# Patient Record
Sex: Female | Born: 1960 | Race: Black or African American | Hispanic: No | Marital: Single | Smoking: Never smoker
Health system: Southern US, Community
[De-identification: ages and names within clinical notes are randomized; demographics above are authoritative.]

## PROBLEM LIST (undated history)

## (undated) HISTORY — PX: UTERINE ARTERY EMBOLIZATION: SHX2629

---

## 2013-12-26 ENCOUNTER — Other Ambulatory Visit (HOSPITAL_COMMUNITY): Payer: Self-pay | Admitting: *Deleted

## 2013-12-26 DIAGNOSIS — N6320 Unspecified lump in the left breast, unspecified quadrant: Secondary | ICD-10-CM

## 2013-12-27 ENCOUNTER — Encounter (INDEPENDENT_AMBULATORY_CARE_PROVIDER_SITE_OTHER): Payer: Self-pay

## 2013-12-27 ENCOUNTER — Ambulatory Visit (HOSPITAL_COMMUNITY)
Admission: RE | Admit: 2013-12-27 | Discharge: 2013-12-27 | Disposition: A | Discharge status: Home / Self Care | Payer: Self-pay | Source: Ambulatory Visit | Attending: Obstetrics and Gynecology | Admitting: Obstetrics and Gynecology

## 2013-12-27 ENCOUNTER — Encounter (HOSPITAL_COMMUNITY): Payer: Self-pay

## 2013-12-27 VITALS — BP 120/74 | Temp 98.4°F | Ht 62.0 in | Wt 153.8 lb

## 2013-12-27 DIAGNOSIS — N6322 Unspecified lump in the left breast, upper inner quadrant: Secondary | ICD-10-CM | POA: Insufficient documentation

## 2013-12-27 DIAGNOSIS — Z1239 Encounter for other screening for malignant neoplasm of breast: Secondary | ICD-10-CM

## 2013-12-27 NOTE — Progress Notes (Signed)
Complaints of left breast pain since December 2014 that has increased. Patient rated the pain at a 9 out of 10.  Pap Smear:  Pap smear not completed today. Last Pap smear was in August 2012 and normal per patient. Per patient has no history of an abnormal Pap smear. No Pap smear results in EPIC.  Physical exam: Breasts Breasts symmetrical. No skin abnormalities bilateral breasts. No nipple retraction bilateral breasts. No nipple discharge bilateral breasts. No lymphadenopathy. No lumps palpated right breast. Palpated a small pea sized lump within the left breast at 10 o'clock 8 cm from the nipple. Patient complained of tenderness when palpated lump. [facility]. Appointment scheduled for Wednesday, January 04, 2014 at 0845.  Pelvic/Bimanual No Pap smear completed today since last Pap smear was in August 2012. Pap smear not indicated per BCCCP guidelines.

## 2013-12-27 NOTE — Patient Instructions (Signed)
Taught Kelsey PrestoPatricia Vargas how to perform BSE and gave educational materials to take home. Patient did not need a Pap smear today due to last Pap smear was in August 2012 per patient. Let her know BCCCP will cover Pap smears every 3 years unless has a history of abnormal Pap smears and that her next Pap smear is due this August. [facility]. Appointment scheduled for Wednesday, January 04, 2014 at 0845. Patient aware of appointment and will be there. Kelsey PrestoPatricia Alfonzo verbalized understanding.  Saintclair Halstedhristine Poll Sourish Allender, RN 1:48 PM

## 2014-01-04 ENCOUNTER — Ambulatory Visit
Admission: RE | Admit: 2014-01-04 | Discharge: 2014-01-04 | Disposition: A | Discharge status: Home / Self Care | Payer: Self-pay | Source: Ambulatory Visit | Attending: Obstetrics and Gynecology | Admitting: Obstetrics and Gynecology

## 2014-01-04 ENCOUNTER — Other Ambulatory Visit (HOSPITAL_COMMUNITY): Payer: Self-pay | Admitting: Obstetrics and Gynecology

## 2014-01-04 DIAGNOSIS — N6320 Unspecified lump in the left breast, unspecified quadrant: Secondary | ICD-10-CM

## 2014-01-04 DIAGNOSIS — N6310 Unspecified lump in the right breast, unspecified quadrant: Secondary | ICD-10-CM

## 2014-01-06 ENCOUNTER — Ambulatory Visit (HOSPITAL_BASED_OUTPATIENT_CLINIC_OR_DEPARTMENT_OTHER): Payer: Self-pay

## 2014-01-06 ENCOUNTER — Other Ambulatory Visit: Payer: Self-pay

## 2014-01-06 DIAGNOSIS — Z0000 Encounter for general adult medical examination without abnormal findings: Secondary | ICD-10-CM

## 2014-01-06 LAB — LIPID PANEL
Cholesterol: 164 mg/dL (ref 0–200)
HDL: 49 mg/dL (ref >39)
LDL CALC: 98 mg/dL (ref 0–99)
Total CHOL/HDL Ratio: 3.3 Ratio
Triglycerides: 87 mg/dL (ref <150)
VLDL: 17 mg/dL (ref 0–40)

## 2014-01-06 LAB — HEMOGLOBIN A1C
Hgb A1c MFr Bld: 5.6 % (ref <5.7)
Mean Plasma Glucose: 114 mg/dL (ref <117)

## 2014-01-06 LAB — GLUCOSE (CC13): GLUCOSE: 86 mg/dL (ref 70–140)

## 2014-01-06 NOTE — Patient Instructions (Signed)
Discussed health assessment with patient.She will be called with results of lab work and we will then discussed any further follow up the patient needs. Patient verbalized understanding. 

## 2014-01-06 NOTE — Progress Notes (Signed)
Patient is a new patient to [facility] and is currently a BCCCP patient effective 12/27/2013.   Clinical Measurements: Patient is 5 ft. 6 inches, weight 169.8 lbs, waist circumference 35 inches, and hip circumference 40.75inches.   Medical History: Patient has no history of high cholesterol. Patient does not have a history of hypertension or diabetes. Per patient no diagnosed history of coronary heart disease, heart attack, heart failure, stroke/TIA, vascular disease or congenital heart defects.   Blood Pressure, Self-measurement: Patient states has no reason to check Blood pressure.  Nutrition Assessment: Patient stated that eats one fruit every day. Patient states she eats 2 servings of vegetables a day.. She  Does not eat 3 or more ounces of whole grains daily. Patient doesn't eat two or more servings of fish weekly.Patient states she does not drink more than 36 ounces or 450 calories of beverages with added sugars weekly. Patient stated she does watch her salt intake.  Physical Activity Assessment: Patient stated she only does 330 minutes of moderate exercise a week and no vigorous exercise.   Smoking Status: Patient had never smoked though is around smoke about 2 hours a day.  Quality of Life Assessment: In assessing patient's quality of life she stated that out of the past 30 days that she has felt her Physical health is good all of them. Patient also stated that in the past 30 days that her mental health is not good including stress, depression and problems with emotions for 4 days. Patient did state that out of the past 30 days she felt her physical or mental health had not kept her from doing her usual activities including self-care, work or recreation.   Plan: Lab work will be done today including a lipid panel, blood glucose, and Hgb A1C. Will call lab results when they are finished. Patient will return for Health Coaching and BP check. Will work on behavior modifications that  we discussed to lower diastolic BP.

## 2014-01-10 ENCOUNTER — Telehealth: Payer: Self-pay

## 2014-01-10 NOTE — Telephone Encounter (Signed)
Attempted to call  Patient to inform of normal lab results. Left message to return call.

## 2014-01-11 ENCOUNTER — Telehealth: Payer: Self-pay

## 2014-01-11 NOTE — Telephone Encounter (Signed)
Returned call concerning lab results. Informed of all lab results and scheduled an appointment for Health Coaching to recheck Blood pressure and work on stress reduction.

## 2014-01-26 ENCOUNTER — Ambulatory Visit: Payer: Self-pay

## 2014-01-26 ENCOUNTER — Telehealth (HOSPITAL_COMMUNITY): Payer: Self-pay | Admitting: *Deleted

## 2014-01-26 NOTE — Telephone Encounter (Signed)
Patient contacted me in regards to her diagnostic mammogram results. Patient stated she has not received her results. Told patient to call the [facility] and speak to the nurse. Let her know they will give her the results to her mammogram and if she doesn't hear from them by Monday to call me back. Patient verbalized understanding.

## 2014-02-21 ENCOUNTER — Telehealth: Payer: Self-pay

## 2014-02-21 NOTE — Telephone Encounter (Addendum)
Missed Health Coaching in April for elevated blood pressure and nutrition for being overweight. Rescheduled an appointment for June 19 @ 8 AM for [facility]  PLAN: Call day before to remind patient of appointment and record information in Sports administrator for [place].

## 2014-03-03 ENCOUNTER — Ambulatory Visit: Payer: Self-pay | Admitting: *Deleted

## 2014-03-03 VITALS — BP 118/82

## 2014-03-03 DIAGNOSIS — Z789 Other specified health status: Secondary | ICD-10-CM

## 2014-03-03 NOTE — Progress Notes (Signed)
Patient returns today for Health Coaching regarding elevated blood pressure, Activity, and Nutrition.(50 minutes education)  HYPERTENSION: Blood Pressure is 118/82. Patient kept saying that her blood pressure had never been as high as was on January 06, 2014. [facility] [employer] and has been under a lot of stress. Discussed behavior modifications for elevated blood pressure that can not be changed and then went over importance of increasing exercise and decreasing Weight, stress, alcohol, salt, and smoking. Patient does not smoke or drink. She stated that when studies does snack on peanuts and some other chips. Went over and gave handout concerning ten ways to decrease salt in your life.   NUTRITION: Discussed the importance of eating three balance meals a day. Patient states is just been eating twice a day and snacking at night. States that she has not been getting enough sleep. Discussed portion sizes, increasing fiber content with complex carbohydrates, serving sizes. States that has been real stressed but just two more week until graduates. Discussed ideal weight range. Patient received handouts and reviewd on: What's on your Plate, Choose My Plate, stress,and healthy eating for an active lifestyle, choosing whole-grain foods,  Make better food choices and serving sizes.  ACTIVITY: Stated that was walking and using pedometer.Per patient tries to do 10,000 steps per day.   PLAN: Review handouts and follow information at home. Will call in a month or two to follow up on how patient is doing.Will do further health coaching as needed and follow blood pressure. Will try to decrease weight. Will manage stress.

## 2014-03-03 NOTE — Patient Instructions (Signed)
Watch salt intake, Decrease weight and stress. Offered weight watchers but did not want at present time.Be aware of portion sizes and to eat three balanced meals a day. Verbalized understanding.

## 2014-04-25 ENCOUNTER — Telehealth: Payer: Self-pay

## 2014-04-25 NOTE — Telephone Encounter (Signed)
Called to follow up with Health Coaching on [facility] and make sure blood pressure ok and discuss going to the clinic. Asked patient to return call.

## 2014-05-15 ENCOUNTER — Telehealth: Payer: Self-pay

## 2014-05-15 NOTE — Telephone Encounter (Signed)
Second attempt to reach patient. Message left on cell and home voice mail to call back for follow up on [facility].

## 2014-05-17 ENCOUNTER — Telehealth: Payer: Self-pay

## 2014-05-17 NOTE — Telephone Encounter (Signed)
Called to follow up on [facility]. Patient stated that had made some changes in eating habits but not enough. When asked about exercise patient stated was not doing enough. Per patient is checking blood pressure and it has been with in normal range.  Plan: Call in 3 to 4 weeks for re assessment and re screen in a year from enrollment.

## 2014-06-09 ENCOUNTER — Other Ambulatory Visit: Payer: Self-pay | Admitting: Obstetrics and Gynecology

## 2014-06-09 DIAGNOSIS — N63 Unspecified lump in unspecified breast: Secondary | ICD-10-CM

## 2014-06-23 ENCOUNTER — Telehealth: Payer: Self-pay

## 2014-06-23 NOTE — Telephone Encounter (Signed)
Health Coaching [facility]: Patient called due to a lab bill that keeps receiving. Per patient she is gaining weight because her clothes are too getting tight. Patient stated that she was doing the Atkins diet. Informed patient that it was not a healthy choice that she needed to refer back to the [facility] book. After talking with patient it appears that she is not eating enough and is eating fatty foods. Patient and I discussed healthy options in preparing and what choices she can make. Informed patient that need to be eating 4 to 5 ounces/ servings grains/day, 21/2 cups vegetables, 2 cups fruit and 4 to 5 ounces protein. Discussed that needed to be eating more fish. Discussed how could prepare and not eat fried foods. Discussed what items were considered protein and grains/starches and what was a serving size.  Plan: Will try and follow number of servings to eat/day. Will not eat fried foods.Will eat three times a day. Will continue to increase exercise.Will measure serving sizes. Will follow up with patient again in 2 to 3 weeks. Patient will fax bill over to Manager again, so can follow up.

## 2014-07-07 ENCOUNTER — Encounter (INDEPENDENT_AMBULATORY_CARE_PROVIDER_SITE_OTHER): Payer: Self-pay

## 2014-07-07 ENCOUNTER — Ambulatory Visit
Admission: RE | Admit: 2014-07-07 | Discharge: 2014-07-07 | Disposition: A | Discharge status: Home / Self Care | Payer: No Typology Code available for payment source | Source: Ambulatory Visit | Attending: Obstetrics and Gynecology | Admitting: Obstetrics and Gynecology

## 2014-07-07 DIAGNOSIS — N63 Unspecified lump in unspecified breast: Secondary | ICD-10-CM

## 2014-07-17 ENCOUNTER — Encounter (HOSPITAL_COMMUNITY): Payer: Self-pay

## 2014-08-09 ENCOUNTER — Ambulatory Visit (HOSPITAL_COMMUNITY)
Admission: RE | Admit: 2014-08-09 | Discharge: 2014-08-09 | Disposition: A | Discharge status: Home / Self Care | Payer: Self-pay | Source: Ambulatory Visit | Attending: Obstetrics and Gynecology | Admitting: Obstetrics and Gynecology

## 2014-08-09 ENCOUNTER — Encounter (HOSPITAL_COMMUNITY): Payer: Self-pay

## 2014-08-09 VITALS — BP 112/74 | Ht 62.0 in | Wt 152.6 lb

## 2014-08-09 DIAGNOSIS — Z01419 Encounter for gynecological examination (general) (routine) without abnormal findings: Secondary | ICD-10-CM

## 2014-08-09 NOTE — Progress Notes (Signed)
No complaints today.  Pap Smear: Pap smear completed today. Last Pap smear was in August 2012 and normal per patient. Per patient has no history of an abnormal Pap smear. No Pap smear results in EPIC.    Pelvic/Bimanual   Ext Genitalia No lesions, no swelling and no discharge observed on external genitalia.         Vagina Vagina pink and normal texture. No lesions or discharge observed in vagina.          Cervix Cervix is present. Cervix pink and of normal texture. No discharge observed.     Uterus Uterus is present and palpable. Uterus in normal position and normal size.        Adnexae Bilateral ovaries present and palpable. No tenderness on palpation.          Rectovaginal No rectal exam completed today since patient had no rectal complaints. No skin abnormalities observed on exam.

## 2014-08-09 NOTE — Patient Instructions (Signed)
Explained to Kelsey PrestoPatricia Vargas that BCCCP will cover Pap smears and co-testing every 5 years unless has a history of abnormal Pap smears. Let patient know will follow up with her within the next couple weeks with results of Pap smear by phone. Kelsey Vargas verbalized understanding.

## 2014-08-14 LAB — CYTOLOGY - PAP

## 2014-09-04 ENCOUNTER — Telehealth (HOSPITAL_COMMUNITY): Payer: Self-pay | Admitting: *Deleted

## 2014-09-04 NOTE — Telephone Encounter (Signed)
Telephoned patient at home # and discussed negative pap smear results. Negative HPV results. Next pap due in 5 years. Patient voiced understanding.

## 2014-09-13 ENCOUNTER — Telehealth: Payer: Self-pay

## 2014-09-13 NOTE — Telephone Encounter (Signed)
Called for follow up assessment and left message to return call.

## 2014-09-14 ENCOUNTER — Telehealth: Payer: Self-pay

## 2014-09-14 NOTE — Telephone Encounter (Signed)
This is a follow up Assessment following Health Coaching with [facility]. .  Medication Status : Patient states is not taking medication for high cholesterol, hypertension, or diabetes.   Blood Pressure, Self-measurement: Patient states has no reason to check Blood pressure.  Nutrition Assessment: Patient stated that eats 2 fruits every day. Patient states she eats 2 servings of vegetables a day. Per Patient eats 3 or more ounces of whole grains daily. Patient stated doesn't eat two or more servings of fish weekly.  Patient states she does not drink more than 36 ounces or 450 calories of beverages with added sugars weekly. Patient stated she does watch her salt intake.   Physical Activity Assessment: Patient stated she goes to gym and is going yoga, Tia Chi, and other class during week 3 and 4 nights. Per patient is doing around 840 minutes of moderate activity and 120 minutes vigorous per week. Smoking Status: Patient had never smoked until 6 months ago. Her present boyfriend is a heavy smoker. She smokes about one small cigar a night. Patient is exposed to smoke a half a hour  A day.  Quality of Life Assessment: In assessing patient's quality of life she stated that out of the past 30 days that she has felt her health is good all of them. Patient also stated that in the past 30 days that her mental health is not good including stress, depression and problems with emotions for all days. Patient did state that out of the past 30 days she felt her physical or mental health had not kept her from doing her usual activities including self-care, work or recreation.   OTHER: Per patient has joined the gym and increased activity and is feeling a lot better.  Plan: [facility] due to has [employer] and will not need BCCCP next year.

## 2014-11-28 ENCOUNTER — Telehealth (HOSPITAL_COMMUNITY): Payer: Self-pay | Admitting: *Deleted

## 2014-11-28 NOTE — Telephone Encounter (Signed)
Attempted to call patient to schedule BCCCP appointment. No one answered phone. Left voicemail for patient to call me back. 

## 2014-11-28 NOTE — Telephone Encounter (Signed)
Patient returned phone call. Reminder [facility] to [facility]. 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 She is no longer eligible for BCCCP. Let patient know she can call the Breast Center directly to schedule. Patient verbalized understanding.

## 2015-07-03 NOTE — Progress Notes (Signed)
Patient received health insurance in March and has not been seen in BCCCP in over a year. Patient's status is now inactive with [facility] effective 07/03/2015 dur to not eligible for BCCCP.

## 2015-12-14 ENCOUNTER — Encounter: Payer: Self-pay | Admitting: Internal Medicine

## 2015-12-14 ENCOUNTER — Ambulatory Visit (INDEPENDENT_AMBULATORY_CARE_PROVIDER_SITE_OTHER): Payer: Self-pay | Admitting: Internal Medicine

## 2015-12-14 ENCOUNTER — Ambulatory Visit: Payer: Self-pay | Admitting: Allergy and Immunology

## 2015-12-14 VITALS — BP 110/76 | HR 80 | Temp 98.4°F | Resp 16 | Ht 62.01 in | Wt 147.9 lb

## 2015-12-14 DIAGNOSIS — J3089 Other allergic rhinitis: Secondary | ICD-10-CM

## 2015-12-14 DIAGNOSIS — L209 Atopic dermatitis, unspecified: Secondary | ICD-10-CM | POA: Diagnosis not present

## 2015-12-14 DIAGNOSIS — J302 Other seasonal allergic rhinitis: Secondary | ICD-10-CM | POA: Insufficient documentation

## 2015-12-14 MED ORDER — OLOPATADINE HCL 0.7 % OP SOLN: 1.0000 [drp] | Freq: Every day | OPHTHALMIC | Status: DC

## 2015-12-14 MED ORDER — OLOPATADINE HCL 0.7 % OP SOLN: 1.0000 | Freq: Every day | OPHTHALMIC | Status: DC

## 2015-12-14 MED ORDER — LEVOCETIRIZINE DIHYDROCHLORIDE 5 MG PO TABS: 5.0000 mg | ORAL_TABLET | Freq: Every evening | ORAL | Status: DC

## 2015-12-14 MED ORDER — MONTELUKAST SODIUM 10 MG PO TABS: 10.0000 mg | ORAL_TABLET | ORAL | Status: DC | PRN

## 2015-12-14 MED ORDER — DESONIDE 0.05 % EX CREA: TOPICAL_CREAM | CUTANEOUS | Status: AC

## 2015-12-14 MED ORDER — FLUTICASONE PROPIONATE 50 MCG/ACT NA SUSP: 2.0000 | Freq: Every day | NASAL | Status: DC

## 2015-12-14 MED ORDER — MONTELUKAST SODIUM 10 MG PO TABS: 10.0000 mg | ORAL_TABLET | Freq: Every day | ORAL | Status: DC

## 2015-12-14 MED ORDER — EPINEPHRINE 0.3 MG/0.3ML IJ SOAJ
0.3000 mg | Freq: Once | INTRAMUSCULAR | Status: DC
Start: 2015-12-14 — End: 2017-09-22

## 2015-12-14 NOTE — Assessment & Plan Note (Signed)
   Start desonide twice a day to affected areas sparingly as needed  Use products that do not contain any dyes or perfumes (free and clear products)

## 2015-12-14 NOTE — Patient Instructions (Addendum)
Other allergic rhinitis  Start allergy injections. Given EpiPen and action plan head educated on use  Use Xyzal (levocetirizine) 5 mg daily  Use fluticasone 2 sprays each nostril daily  Use montelukast (Singulair) 10 mg daily  Use Pataday or Pazeo 1 drop each eye daily Given Prednisone 10 mg tablets. Take 1 tablet twice a day for 4 days, then 1 tablet on day #5.   Atopic dermatitis  Start desonide twice a day to affected areas sparingly as needed  Use products that do not contain any dyes or perfumes (free and clear products)

## 2015-12-14 NOTE — Progress Notes (Signed)
History of Present Illness: Kelsey Vargas is a 55 y.o. female presenting for follow-up  HPI Comments: Allergic rhinitis/dermatitis: Skin testing in the past was positive for grass, weed, tree, mold, dog, cat. She was started on Xyzal, fluticasone, Zaditor, montelukast and prednisone at last visit and had good symptom control. She was able to use her medications on an as needed basis after the spring. For the past several days, she has been having return of her symptoms. Specifically, she has had hoarseness, ocular pruritus, eyelid dermatitis. She restarted her Veramyst, Singulair, Pazeo last week but has continued to have breakthrough symptoms.   No current outpatient prescriptions on file prior to visit.   No current facility-administered medications on file prior to visit.    Assessment and Plan: Other allergic rhinitis  Start allergy injections. Given EpiPen and action plan - educated on use  Use Xyzal (levocetirizine) 5 mg daily  Use fluticasone 2 sprays each nostril daily  Use montelukast (Singulair) 10 mg daily  Use Pataday or Pazeo 1 drop each eye daily Given Prednisone 10 mg tablets. Take 1 tablet twice a day for 4 days, then 1 tablet on day #5.   Atopic dermatitis  Start desonide twice a day to affected areas sparingly as needed  Use products that do not contain any dyes or perfumes (free and clear products)    Return in about 4 weeks (around 01/11/2016).  Meds ordered this encounter  Medications  . DISCONTD: PAZEO 0.7 % SOLN    Sig:   . fluticasone (VERAMYST) 27.5 MCG/SPRAY nasal spray    Sig: Place 1 spray into the nose as needed for rhinitis.  Marland Kitchen DISCONTD: montelukast (SINGULAIR) 10 MG tablet    Sig: Take 10 mg by mouth as needed.  Marland Kitchen EPINEPHrine (EPIPEN 2-PAK) 0.3 mg/0.3 mL IJ SOAJ injection    Sig: Inject 0.3 mLs (0.3 mg total) into the muscle once.    Dispense:  1 Device    Refill:  3  . levocetirizine (XYZAL) 5 MG tablet    Sig: Take 1 tablet (5 mg  total) by mouth every evening.    Dispense:  30 tablet    Refill:  5  . fluticasone (EQL FLUTICASONE PROPIONATE) 50 MCG/ACT nasal spray    Sig: Place 2 sprays into both nostrils daily.    Dispense:  1 g    Refill:  5  . montelukast (SINGULAIR) 10 MG tablet    Sig: Take 1 tablet (10 mg total) by mouth at bedtime.    Dispense:  30 tablet    Refill:  5  . montelukast (SINGULAIR) 10 MG tablet    Sig: Take 1 tablet (10 mg total) by mouth as needed.    Dispense:  30 tablet    Refill:  5  . DISCONTD: Olopatadine HCl (PAZEO) 0.7 % SOLN    Sig: Apply 1 Bottle to eye daily.    Dispense:  1 Bottle    Refill:  5  . desonide (DESOWEN) 0.05 % cream    Sig: APPLY TO AFFECTED AREAS AS NEEDED.    Dispense:  30 g    Refill:  3  . Olopatadine HCl (PAZEO) 0.7 % SOLN    Sig: Place 1 drop into both eyes daily.    Dispense:  1 Bottle    Refill:  5   Physical Exam: BP 110/76 mmHg  Pulse 80  Temp(Src) 98.4 F (36.9 C) (Oral)  Resp 16  Ht 5' 2.01" (1.575 m)  Wt 147 lb 14.9  oz (67.1 kg)  BMI 27.05 kg/m2   Physical Exam  Constitutional: She appears well-developed and well-nourished. No distress.  HENT:  Right Ear: External ear normal.  Left Ear: External ear normal.  Mouth/Throat: Oropharynx is clear and moist.  Nasal membrane edema, pale, clear rhinorrhea  Eyes: Conjunctivae are normal. Right eye exhibits no discharge. Left eye exhibits no discharge.  Cardiovascular: Normal rate, regular rhythm and normal heart sounds.   No murmur heard. Pulmonary/Chest: Effort normal and breath sounds normal. No respiratory distress. She has no wheezes. She has no rales.  Abdominal: Soft. Bowel sounds are normal.  Musculoskeletal: She exhibits no edema.  Lymphadenopathy:    She has no cervical adenopathy.  Neurological: She is alert.  Skin: No rash noted.  Vitals reviewed.   Drug Allergies:  No Known Allergies  ROS: Per HPI unless specifically indicated below Review of Systems  Thank you for the  opportunity to care for this patient.  Please do not hesitate to contact me with questions.

## 2015-12-14 NOTE — Assessment & Plan Note (Addendum)
   Start allergy injections. Given EpiPen and action plan - educated on use  Use Xyzal (levocetirizine) 5 mg daily  Use fluticasone 2 sprays each nostril daily  Use montelukast (Singulair) 10 mg daily  Use Pataday or Pazeo 1 drop each eye daily Given Prednisone 10 mg tablets. Take 1 tablet twice a day for 4 days, then 1 tablet on day #5.

## 2015-12-17 ENCOUNTER — Other Ambulatory Visit: Payer: Self-pay | Admitting: Allergy

## 2015-12-17 MED ORDER — OLOPATADINE HCL 0.7 % OP SOLN: 1.0000 [drp] | Freq: Every day | OPHTHALMIC | Status: DC

## 2015-12-19 DIAGNOSIS — J301 Allergic rhinitis due to pollen: Secondary | ICD-10-CM | POA: Diagnosis not present

## 2015-12-20 ENCOUNTER — Encounter: Payer: Self-pay | Admitting: *Deleted

## 2015-12-20 DIAGNOSIS — J3089 Other allergic rhinitis: Secondary | ICD-10-CM | POA: Diagnosis not present

## 2015-12-31 ENCOUNTER — Ambulatory Visit (INDEPENDENT_AMBULATORY_CARE_PROVIDER_SITE_OTHER): Payer: BLUE CROSS/BLUE SHIELD

## 2015-12-31 DIAGNOSIS — J309 Allergic rhinitis, unspecified: Secondary | ICD-10-CM | POA: Diagnosis not present

## 2015-12-31 NOTE — Progress Notes (Signed)
Immunotherapy   Patient Details  Name: Kelsey Vargas MRN: [id number] Date of Birth: 09/20/1960  12/31/2015  Kelsey PrestoPatricia Vargas started injections for  Blue 100,000 (weed-mold & Grass-Tree-Cat-Dog) Following schedule: A  Frequency:1 time per week Epi-Pen:Epi-Pen Available  Consent signed and patient instructions given.   Damita Gainey 12/31/2015, 9:07 AM

## 2016-01-07 ENCOUNTER — Ambulatory Visit (INDEPENDENT_AMBULATORY_CARE_PROVIDER_SITE_OTHER): Payer: BLUE CROSS/BLUE SHIELD | Admitting: *Deleted

## 2016-01-07 DIAGNOSIS — J309 Allergic rhinitis, unspecified: Secondary | ICD-10-CM

## 2016-01-14 ENCOUNTER — Ambulatory Visit: Payer: BLUE CROSS/BLUE SHIELD | Admitting: Internal Medicine

## 2016-01-17 ENCOUNTER — Ambulatory Visit (INDEPENDENT_AMBULATORY_CARE_PROVIDER_SITE_OTHER): Payer: BLUE CROSS/BLUE SHIELD | Admitting: *Deleted

## 2016-01-17 DIAGNOSIS — J309 Allergic rhinitis, unspecified: Secondary | ICD-10-CM

## 2016-01-21 ENCOUNTER — Ambulatory Visit (INDEPENDENT_AMBULATORY_CARE_PROVIDER_SITE_OTHER): Payer: BLUE CROSS/BLUE SHIELD | Admitting: Internal Medicine

## 2016-01-21 ENCOUNTER — Ambulatory Visit (INDEPENDENT_AMBULATORY_CARE_PROVIDER_SITE_OTHER): Payer: BLUE CROSS/BLUE SHIELD

## 2016-01-21 ENCOUNTER — Encounter: Payer: Self-pay | Admitting: Internal Medicine

## 2016-01-21 VITALS — BP 122/84 | HR 84 | Temp 98.9°F | Resp 16

## 2016-01-21 DIAGNOSIS — J3089 Other allergic rhinitis: Secondary | ICD-10-CM

## 2016-01-21 DIAGNOSIS — J309 Allergic rhinitis, unspecified: Secondary | ICD-10-CM

## 2016-01-21 DIAGNOSIS — L209 Atopic dermatitis, unspecified: Secondary | ICD-10-CM

## 2016-01-21 MED ORDER — BECLOMETHASONE DIPROPIONATE 80 MCG/ACT NA AERS: 2.0000 | INHALATION_SPRAY | Freq: Every day | NASAL | Status: DC

## 2016-01-21 MED ORDER — LEVOCETIRIZINE DIHYDROCHLORIDE 5 MG PO TABS: 5.0000 mg | ORAL_TABLET | Freq: Every evening | ORAL | Status: DC

## 2016-01-21 NOTE — Assessment & Plan Note (Addendum)
   On immunotherapy. Has EpiPen and action plan  Start Xyzal 5 mg daily  Continue Singulair 10 mg daily as needed, Pazeo 1 drop each eye daily as needed  Start Qnasl 2 sprays each nostril daily

## 2016-01-21 NOTE — Assessment & Plan Note (Signed)
   Continue as needed desonide to affected areas

## 2016-01-21 NOTE — Patient Instructions (Addendum)
Other allergic rhinitis  On immunotherapy. Has EpiPen and action plan  Start Xyzal 5 mg daily  Continue Singulair 10 mg daily as needed, Pazeo 1 drop each eye daily as needed  Start Qnasl 2 sprays each nostril daily  Atopic dermatitis  Continue as needed desonide to affected areas

## 2016-01-21 NOTE — Progress Notes (Signed)
History of Present Illness: Kelsey Vargas is a 55 y.o. female presenting for follow-up  HPI Comments: Allergic rhinitis on immunotherapy: Start 12/31/2015. She is continuing to build without any shot reactions. She uses her medications on an as-needed basis and has been having breakthrough nasal symptoms. She would like to try a different nasal spray  Atopic dermatitis: At patient's last visit, she was started on desonide and has had resolution of her symptoms.   Assessment and Plan: Other allergic rhinitis  On immunotherapy. Has EpiPen and action plan  Start Xyzal 5 mg daily  Continue Singulair 10 mg daily as needed, Pazeo 1 drop each eye daily as needed  Start Qnasl 2 sprays each nostril daily  Atopic dermatitis  Continue as needed desonide to affected areas    Return in about 1 year (around 01/20/2017).  Current Outpatient Prescriptions on File Prior to Visit  Medication Sig Dispense Refill  . desonide (DESOWEN) 0.05 % cream APPLY TO AFFECTED AREAS AS NEEDED. 30 g 3  . EPINEPHrine (EPIPEN 2-PAK) 0.3 mg/0.3 mL IJ SOAJ injection Inject 0.3 mLs (0.3 mg total) into the muscle once. 1 Device 3  . montelukast (SINGULAIR) 10 MG tablet Take 1 tablet (10 mg total) by mouth as needed. 30 tablet 5  . Olopatadine HCl (PAZEO) 0.7 % SOLN Place 1 drop into both eyes daily. 1 Bottle 5  . fluticasone (VERAMYST) 27.5 MCG/SPRAY nasal spray Place 1 spray into the nose as needed for rhinitis. Reported on 01/21/2016     No current facility-administered medications on file prior to visit.    Meds ordered this encounter  Medications  . NON FORMULARY    Sig:   . hydrocortisone cream 1 %    Sig: Apply 1 application topically as needed for itching.  . levocetirizine (XYZAL) 5 MG tablet    Sig: Take 1 tablet (5 mg total) by mouth every evening.    Dispense:  30 tablet    Refill:  5    For runny nose or itching  . Beclomethasone Dipropionate 80 MCG/ACT AERS    Sig: Place 2 sprays into the nose  daily.    Dispense:  8.7 g    Refill:  5   Physical Exam: BP 122/84 mmHg  Pulse 84  Temp(Src) 98.9 F (37.2 C) (Oral)  Resp 16   Physical Exam  Constitutional: She appears well-developed and well-nourished. No distress.  HENT:  Right Ear: External ear normal.  Left Ear: External ear normal.  Nose: Nose normal.  Mouth/Throat: Oropharynx is clear and moist.  Eyes: Conjunctivae are normal. Right eye exhibits no discharge. Left eye exhibits no discharge.  Cardiovascular: Normal rate, regular rhythm and normal heart sounds.   No murmur heard. Pulmonary/Chest: Effort normal and breath sounds normal. No respiratory distress. She has no wheezes. She has no rales.  Abdominal: Soft. Bowel sounds are normal.  Musculoskeletal: She exhibits no edema.  Lymphadenopathy:    She has no cervical adenopathy.  Neurological: She is alert.  Skin: No rash noted.  Vitals reviewed.   Patient Active Problem List   Diagnosis Date Noted  . Other allergic rhinitis 12/14/2015  . Atopic dermatitis 12/14/2015  . Breast lump on left side at 10 o'clock position 12/27/2013    Drug Allergies:  No Known Allergies  ROS: Per HPI unless specifically indicated below Review of Systems  Thank you for the opportunity to care for this patient.  Please do not hesitate to contact me with questions.

## 2016-02-06 ENCOUNTER — Ambulatory Visit (INDEPENDENT_AMBULATORY_CARE_PROVIDER_SITE_OTHER): Payer: BLUE CROSS/BLUE SHIELD

## 2016-02-06 ENCOUNTER — Other Ambulatory Visit: Payer: Self-pay

## 2016-02-06 DIAGNOSIS — J309 Allergic rhinitis, unspecified: Secondary | ICD-10-CM | POA: Diagnosis not present

## 2016-02-06 MED ORDER — BECLOMETHASONE DIPROPIONATE 80 MCG/ACT NA AERS: 2.0000 | INHALATION_SPRAY | Freq: Every day | NASAL | Status: DC

## 2016-02-19 ENCOUNTER — Ambulatory Visit (INDEPENDENT_AMBULATORY_CARE_PROVIDER_SITE_OTHER): Payer: BLUE CROSS/BLUE SHIELD | Admitting: *Deleted

## 2016-02-19 DIAGNOSIS — J309 Allergic rhinitis, unspecified: Secondary | ICD-10-CM

## 2016-02-21 ENCOUNTER — Ambulatory Visit (INDEPENDENT_AMBULATORY_CARE_PROVIDER_SITE_OTHER): Payer: BLUE CROSS/BLUE SHIELD

## 2016-02-21 DIAGNOSIS — J309 Allergic rhinitis, unspecified: Secondary | ICD-10-CM

## 2016-03-06 ENCOUNTER — Ambulatory Visit (INDEPENDENT_AMBULATORY_CARE_PROVIDER_SITE_OTHER): Payer: BLUE CROSS/BLUE SHIELD

## 2016-03-06 DIAGNOSIS — J309 Allergic rhinitis, unspecified: Secondary | ICD-10-CM

## 2016-03-12 ENCOUNTER — Ambulatory Visit (INDEPENDENT_AMBULATORY_CARE_PROVIDER_SITE_OTHER): Payer: BLUE CROSS/BLUE SHIELD

## 2016-03-12 DIAGNOSIS — J309 Allergic rhinitis, unspecified: Secondary | ICD-10-CM

## 2016-03-20 ENCOUNTER — Ambulatory Visit (INDEPENDENT_AMBULATORY_CARE_PROVIDER_SITE_OTHER): Payer: BLUE CROSS/BLUE SHIELD

## 2016-03-20 DIAGNOSIS — J309 Allergic rhinitis, unspecified: Secondary | ICD-10-CM

## 2016-03-25 ENCOUNTER — Ambulatory Visit (INDEPENDENT_AMBULATORY_CARE_PROVIDER_SITE_OTHER): Payer: BLUE CROSS/BLUE SHIELD

## 2016-03-25 DIAGNOSIS — J309 Allergic rhinitis, unspecified: Secondary | ICD-10-CM

## 2016-04-03 ENCOUNTER — Ambulatory Visit (INDEPENDENT_AMBULATORY_CARE_PROVIDER_SITE_OTHER): Payer: BLUE CROSS/BLUE SHIELD

## 2016-04-03 DIAGNOSIS — J309 Allergic rhinitis, unspecified: Secondary | ICD-10-CM | POA: Diagnosis not present

## 2016-04-08 ENCOUNTER — Ambulatory Visit (INDEPENDENT_AMBULATORY_CARE_PROVIDER_SITE_OTHER): Payer: BLUE CROSS/BLUE SHIELD | Admitting: *Deleted

## 2016-04-08 DIAGNOSIS — J309 Allergic rhinitis, unspecified: Secondary | ICD-10-CM | POA: Diagnosis not present

## 2016-04-15 ENCOUNTER — Ambulatory Visit (INDEPENDENT_AMBULATORY_CARE_PROVIDER_SITE_OTHER): Payer: BLUE CROSS/BLUE SHIELD

## 2016-04-15 DIAGNOSIS — J309 Allergic rhinitis, unspecified: Secondary | ICD-10-CM

## 2016-04-24 ENCOUNTER — Ambulatory Visit (INDEPENDENT_AMBULATORY_CARE_PROVIDER_SITE_OTHER): Payer: BLUE CROSS/BLUE SHIELD

## 2016-04-24 DIAGNOSIS — J309 Allergic rhinitis, unspecified: Secondary | ICD-10-CM

## 2016-05-02 ENCOUNTER — Ambulatory Visit (INDEPENDENT_AMBULATORY_CARE_PROVIDER_SITE_OTHER): Payer: BLUE CROSS/BLUE SHIELD | Admitting: *Deleted

## 2016-05-02 DIAGNOSIS — J309 Allergic rhinitis, unspecified: Secondary | ICD-10-CM | POA: Diagnosis not present

## 2016-05-06 ENCOUNTER — Ambulatory Visit (INDEPENDENT_AMBULATORY_CARE_PROVIDER_SITE_OTHER): Payer: BLUE CROSS/BLUE SHIELD | Admitting: *Deleted

## 2016-05-06 DIAGNOSIS — J309 Allergic rhinitis, unspecified: Secondary | ICD-10-CM

## 2016-05-16 ENCOUNTER — Ambulatory Visit (INDEPENDENT_AMBULATORY_CARE_PROVIDER_SITE_OTHER): Payer: BLUE CROSS/BLUE SHIELD

## 2016-05-16 DIAGNOSIS — J309 Allergic rhinitis, unspecified: Secondary | ICD-10-CM | POA: Diagnosis not present

## 2016-05-20 ENCOUNTER — Ambulatory Visit (INDEPENDENT_AMBULATORY_CARE_PROVIDER_SITE_OTHER): Payer: BLUE CROSS/BLUE SHIELD

## 2016-05-20 DIAGNOSIS — J309 Allergic rhinitis, unspecified: Secondary | ICD-10-CM

## 2016-06-03 ENCOUNTER — Ambulatory Visit (INDEPENDENT_AMBULATORY_CARE_PROVIDER_SITE_OTHER): Payer: BLUE CROSS/BLUE SHIELD

## 2016-06-03 DIAGNOSIS — J309 Allergic rhinitis, unspecified: Secondary | ICD-10-CM | POA: Diagnosis not present

## 2016-06-04 ENCOUNTER — Encounter: Payer: Self-pay | Admitting: Emergency Medicine

## 2016-06-04 ENCOUNTER — Emergency Department (INDEPENDENT_AMBULATORY_CARE_PROVIDER_SITE_OTHER)
Admission: EM | Admit: 2016-06-04 | Discharge: 2016-06-04 | Disposition: A | Discharge status: Home / Self Care | Payer: BLUE CROSS/BLUE SHIELD | Source: Home / Self Care | Attending: Family Medicine | Admitting: Family Medicine

## 2016-06-04 DIAGNOSIS — L03115 Cellulitis of right lower limb: Secondary | ICD-10-CM | POA: Diagnosis not present

## 2016-06-04 DIAGNOSIS — L03116 Cellulitis of left lower limb: Secondary | ICD-10-CM

## 2016-06-04 MED ORDER — PREDNISONE 20 MG PO TABS: 20.0000 mg | ORAL_TABLET | Freq: Two times a day (BID) | ORAL | 0 refills | Status: AC

## 2016-06-04 MED ORDER — DOXYCYCLINE HYCLATE 100 MG PO CAPS: 100.0000 mg | ORAL_CAPSULE | Freq: Two times a day (BID) | ORAL | 0 refills | Status: AC

## 2016-06-04 NOTE — ED Provider Notes (Signed)
[facility]    CSN: [id number] Arrival date & time: 06/04/16  1338  First Provider Contact:  First MD Initiated Contact with Patient 06/04/16 1407        History   Chief Complaint Chief Complaint  Patient presents with  . Insect Bite    HPI Kelsey Vargas is a 55 y.o. female.   Patient reports that 3 days ago while sitting in a chair, she felt small insects on her lower legs, and subsequently noticed a pruritic rash.  Several lesions have become increasingly red and swollen.  She feels well otherwise.  No fevers, chills, and sweats.   The history is provided by the patient.  Rash  Location:  Leg Leg rash location:  L upper leg, R upper leg and L lower leg Quality: dryness, itchiness and redness   Quality: not blistering, not bruising, not burning, not draining, not painful, not peeling, not scaling, not swelling and not weeping   Severity:  Moderate Onset quality:  Gradual Duration:  3 days Timing:  Constant Progression:  Worsening Chronicity:  New Context: insect bite/sting   Context: not animal contact, not chemical exposure, not eggs, not exposure to similar rash, not food, not hot tub use, not medications, not new detergent/soap and not plant contact   Relieved by:  None tried Worsened by:  Nothing Ineffective treatments:  None tried Associated symptoms: no diarrhea, no fatigue, no fever, no headaches, no induration, no joint pain, no myalgias, no nausea, no shortness of breath, no sore throat, no throat swelling, no tongue swelling and no URI     History reviewed. No pertinent past medical history.  Patient Active Problem List   Diagnosis Date Noted  . Other allergic rhinitis 12/14/2015  . Atopic dermatitis 12/14/2015  . Breast lump on left side at 10 o'clock position 12/27/2013    Past Surgical History:  Procedure Laterality Date  . UTERINE ARTERY EMBOLIZATION      OB History    Gravida Para Term Preterm AB Living   2 2 2     2    SAB TAB  Ectopic Multiple Live Births                   Home Medications    Prior to Admission medications   Medication Sig Start Date End Date Taking? Authorizing Provider  Beclomethasone Dipropionate (QNASL) 80 MCG/ACT AERS Place 2 puffs into the nose daily. 02/06/16   Mikki Santee, MD  Beclomethasone Dipropionate 80 MCG/ACT AERS Place 2 sprays into the nose daily. 01/21/16   Mikki Santee, MD  desonide (DESOWEN) 0.05 % cream APPLY TO AFFECTED AREAS AS NEEDED. 12/14/15   Mikki Santee, MD  doxycycline (VIBRAMYCIN) 100 MG capsule Take 1 capsule (100 mg total) by mouth 2 (two) times daily. Take with food. 06/04/16   Lattie Haw, MD  EPINEPHrine (EPIPEN 2-PAK) 0.3 mg/0.3 mL IJ SOAJ injection Inject 0.3 mLs (0.3 mg total) into the muscle once. 12/14/15   Mikki Santee, MD  fluticasone (VERAMYST) 27.5 MCG/SPRAY nasal spray Place 1 spray into the nose as needed for rhinitis. Reported on 01/21/2016    Historical Provider, MD  hydrocortisone cream 1 % Apply 1 application topically as needed for itching.    Historical Provider, MD  levocetirizine (XYZAL) 5 MG tablet Take 1 tablet (5 mg total) by mouth every evening. 01/21/16   Mikki Santee, MD  montelukast (SINGULAIR) 10 MG tablet Take 1 tablet (10 mg total) by mouth as needed. 12/14/15  Mikki Santee, MD  NON FORMULARY     Historical Provider, MD  Olopatadine HCl (PAZEO) 0.7 % SOLN Place 1 drop into both eyes daily. 12/17/15   Mikki Santee, MD  predniSONE (DELTASONE) 20 MG tablet Take 1 tablet (20 mg total) by mouth 2 (two) times daily. Take with food. 06/04/16   Lattie Haw, MD    Family History Family History  Problem Relation Age of Onset  . Cancer Mother     lung  . Hypertension Mother   . Hypertension Father   . Cancer Brother     colon  . Cancer Maternal Grandmother     colon  . Hyperlipidemia Sister   . Hypertension Sister   . Stroke Sister     Social History Social History  Substance Use Topics  . Smoking status: Never Smoker  . Smokeless  tobacco: Never Used  . Alcohol use No     Allergies   Review of patient's allergies indicates no known allergies.   Review of Systems Review of Systems  Constitutional: Negative for fatigue and fever.  HENT: Negative for sore throat.   Respiratory: Negative for shortness of breath.   Gastrointestinal: Negative for diarrhea and nausea.  Musculoskeletal: Negative for arthralgias and myalgias.  Skin: Positive for rash.  Neurological: Negative for headaches.  All other systems reviewed and are negative.    Physical Exam Triage Vital Signs ED Triage Vitals  Enc Vitals Group     BP 06/04/16 1400 116/80     Pulse Rate 06/04/16 1400 96     Resp --      Temp 06/04/16 1400 98.2 F (36.8 C)     Temp Source 06/04/16 1400 Oral     SpO2 06/04/16 1400 96 %     Weight 06/04/16 1400 150 lb (68 kg)     Height 06/04/16 1400 5\' 2"  (1.575 m)     Head Circumference --      Peak Flow --      Pain Score 06/04/16 1402 2     Pain Loc --      Pain Edu? --      Excl. in GC? --    No data found.   Updated Vital Signs BP 116/80 (BP Location: Left Arm)   Pulse 96   Temp 98.2 F (36.8 C) (Oral)   Ht 5\' 2"  (1.575 m)   Wt 150 lb (68 kg)   SpO2 96%   BMI 27.44 kg/m   Visual Acuity Right Eye Distance:   Left Eye Distance:   Bilateral Distance:    Right Eye Near:   Left Eye Near:    Bilateral Near:     Physical Exam  Constitutional: She appears well-developed and well-nourished. No distress.  HENT:  Head: Normocephalic.  Right Ear: External ear normal.  Left Ear: External ear normal.  Nose: Nose normal.  Mouth/Throat: Oropharynx is clear and moist.  Eyes: Pupils are equal, round, and reactive to light.  Neck: Neck supple.  Cardiovascular: Normal heart sounds.   Pulmonary/Chest: Breath sounds normal.  Abdominal: There is no tenderness.  Musculoskeletal: She exhibits no edema.  Lymphadenopathy:    She has no cervical adenopathy.  Neurological: She is alert.  Skin: Skin is  warm and dry. Rash noted.     Both medial thighs, and left posterior calf, have areas of macular erythema as noted on diagram.  No tenderness, warmth, or fluctuance.  Nursing note and vitals reviewed.    UC Treatments / Results  Labs (all labs ordered are listed, but only abnormal results are displayed) Labs Reviewed - No data to display  EKG  EKG Interpretation None       Radiology No results found.  Procedures Procedures (including critical care time)  Medications Ordered in UC Medications - No data to display   Initial Impression / Assessment and Plan / UC Course  I have reviewed the triage vital signs and the nursing notes.  Pertinent labs & imaging results that were available during my care of the patient were reviewed by me and considered in my medical decision making (see chart for details).  Clinical Course  No evidence DVT on exam Begin doxycycline 100mg  BID for staph coverage.  Prednisone burst. May take Benadryl at bedtime if needed for itching. If symptoms become significantly worse during the night or over the weekend, proceed to the local emergency room.  Followup with Family Doctor if not improved in about 5 days.     Final Clinical Impressions(s) / UC Diagnoses   Final diagnoses:  Cellulitis of right lower extremity  Cellulitis of left lower extremity    New Prescriptions New Prescriptions   DOXYCYCLINE (VIBRAMYCIN) 100 MG CAPSULE    Take 1 capsule (100 mg total) by mouth 2 (two) times daily. Take with food.   PREDNISONE (DELTASONE) 20 MG TABLET    Take 1 tablet (20 mg total) by mouth 2 (two) times daily. Take with food.     Lattie HawStephen A Reubin Bushnell, MD 06/12/16 [phone]

## 2016-06-04 NOTE — ED Triage Notes (Signed)
Insect bites on upper legs x 3 days, red, swollen itchy

## 2016-06-04 NOTE — Discharge Instructions (Signed)
May take Benadryl at bedtime if needed for itching. ° °If symptoms become significantly worse during the night or over the weekend, proceed to the local emergency room.  °

## 2016-06-20 ENCOUNTER — Ambulatory Visit (INDEPENDENT_AMBULATORY_CARE_PROVIDER_SITE_OTHER): Payer: BLUE CROSS/BLUE SHIELD | Admitting: *Deleted

## 2016-06-20 DIAGNOSIS — J3089 Other allergic rhinitis: Secondary | ICD-10-CM | POA: Diagnosis not present

## 2016-06-26 ENCOUNTER — Ambulatory Visit (INDEPENDENT_AMBULATORY_CARE_PROVIDER_SITE_OTHER): Payer: BLUE CROSS/BLUE SHIELD | Admitting: *Deleted

## 2016-06-26 DIAGNOSIS — J309 Allergic rhinitis, unspecified: Secondary | ICD-10-CM

## 2016-06-26 DIAGNOSIS — J3089 Other allergic rhinitis: Secondary | ICD-10-CM

## 2016-07-03 ENCOUNTER — Ambulatory Visit (INDEPENDENT_AMBULATORY_CARE_PROVIDER_SITE_OTHER): Payer: BLUE CROSS/BLUE SHIELD | Admitting: *Deleted

## 2016-07-03 DIAGNOSIS — J3089 Other allergic rhinitis: Secondary | ICD-10-CM | POA: Diagnosis not present

## 2016-07-10 ENCOUNTER — Ambulatory Visit (INDEPENDENT_AMBULATORY_CARE_PROVIDER_SITE_OTHER): Payer: BLUE CROSS/BLUE SHIELD | Admitting: *Deleted

## 2016-07-10 DIAGNOSIS — J3089 Other allergic rhinitis: Secondary | ICD-10-CM

## 2016-07-17 ENCOUNTER — Ambulatory Visit (INDEPENDENT_AMBULATORY_CARE_PROVIDER_SITE_OTHER): Payer: BLUE CROSS/BLUE SHIELD | Admitting: *Deleted

## 2016-07-17 DIAGNOSIS — J3089 Other allergic rhinitis: Secondary | ICD-10-CM

## 2016-07-24 ENCOUNTER — Ambulatory Visit (INDEPENDENT_AMBULATORY_CARE_PROVIDER_SITE_OTHER): Payer: BLUE CROSS/BLUE SHIELD | Admitting: *Deleted

## 2016-07-24 DIAGNOSIS — J3089 Other allergic rhinitis: Secondary | ICD-10-CM | POA: Diagnosis not present

## 2016-07-31 ENCOUNTER — Ambulatory Visit (INDEPENDENT_AMBULATORY_CARE_PROVIDER_SITE_OTHER): Payer: BLUE CROSS/BLUE SHIELD | Admitting: *Deleted

## 2016-07-31 DIAGNOSIS — J3089 Other allergic rhinitis: Secondary | ICD-10-CM

## 2016-07-31 NOTE — Progress Notes (Signed)
Immunotherapy   Patient Details  Name: Kelsey Prestoatricia Kelliher MRN: [id number] Date of Birth: 20-Apr-1961  07/31/2016  Kelsey PrestoPatricia Jenniges here to pick up  Gold vial (1:10,000) - Grass-Tree-Cat-Dog & Green vial (1:1000) - Weed-Mold.  Following schedule: A  Frequency:1-2 times per week.  Epi-Pen:Epi-Pen Available  Consent signed and patient instructions given. Patient will be getting her injections @ work from the nurse Garwin BrothersAnn Durbin, RN @ [employer].    Vella RedheadHeather Clark 07/31/2016, 6:23 PM

## 2016-09-23 ENCOUNTER — Ambulatory Visit: Payer: Self-pay | Admitting: *Deleted

## 2016-09-25 DIAGNOSIS — J3089 Other allergic rhinitis: Secondary | ICD-10-CM | POA: Diagnosis not present

## 2016-09-26 DIAGNOSIS — J301 Allergic rhinitis due to pollen: Secondary | ICD-10-CM | POA: Diagnosis not present

## 2016-10-20 ENCOUNTER — Encounter: Payer: Self-pay | Admitting: *Deleted

## 2016-10-20 NOTE — Progress Notes (Signed)
Immunotherapy   Patient Details  Name: Kelsey Vargas MRN: [id number] Date of Birth: Feb 08, 1961  09/30/2016  Kelsey PrestoPatricia Collignon [facility]  [facility].  Following schedule: A  Frequency:1 time per week Epi-Pen:Epi-Pen Available  Consent signed and patient instructions given. Patient picked up vials only. Patient gets shot at her job by Garwin BrothersAnn Durbin, RN @ [employer].    Vella RedheadHeather Clark 10/20/2016, 2:25 PM

## 2016-10-21 ENCOUNTER — Ambulatory Visit: Payer: Self-pay

## 2016-10-21 NOTE — Progress Notes (Signed)
Immunotherapy   Patient Details  Name: Kelsey Prestoatricia Vargas MRN: [id number] Date of Birth: 04-Oct-1960  10/21/2016  Kelsey PrestoPatricia Vargas here to pick up  allergy vial.  Following schedule: A  Frequency:1 time per week Epi-Pen:Epi-Pen Available  Consent signed and patient instructions given.  Patient picked up green vial Grass-tree-cat-dog today. No shot given. Pt receives injections by Gus PumaAnn D[place] with [employer].   Amber Wood 10/21/2016, 11:52 AM

## 2016-12-25 ENCOUNTER — Encounter: Payer: Self-pay | Admitting: *Deleted

## 2016-12-25 ENCOUNTER — Ambulatory Visit (INDEPENDENT_AMBULATORY_CARE_PROVIDER_SITE_OTHER): Payer: BLUE CROSS/BLUE SHIELD | Admitting: *Deleted

## 2016-12-25 DIAGNOSIS — J309 Allergic rhinitis, unspecified: Secondary | ICD-10-CM | POA: Diagnosis not present

## 2017-01-05 NOTE — Progress Notes (Signed)
Immunotherapy   Patient Details  Name: Kelsey Vargas MRN: [id number] Date of Birth: 20-Apr-1961  12/25/2016  Vivien Presto here to pick up  Green vial 1:1000 - Grass-Tree-Cat-Dog & Red vial 1:100 - Weed-Mold. Following schedule: A  Frequency:1 time per week Epi-Pen:Epi-Pen Available  Consent signed and patient instructions given. No problem after 30 minutes in office. Patient receives injections from her nurse at work. [employer], RN.    Vella Redhead 12/26/2016, 11:02 AM

## 2017-02-10 ENCOUNTER — Telehealth: Payer: Self-pay | Admitting: Allergy

## 2017-02-10 ENCOUNTER — Other Ambulatory Visit: Payer: Self-pay | Admitting: Allergy

## 2017-02-10 ENCOUNTER — Other Ambulatory Visit: Payer: Self-pay

## 2017-02-10 MED ORDER — BECLOMETHASONE DIPROPIONATE 80 MCG/ACT NA AERS: 2.0000 | INHALATION_SPRAY | Freq: Every day | NASAL | 0 refills | Status: DC

## 2017-02-10 NOTE — Telephone Encounter (Signed)
Patient said she gets shots and was last seen in [place] by Dr. Clydie BraunBhatti on 01-21-16. She wanted a refill on her Qnasal. [facility] pharmacy is [facility] [address]. I made her an appointment with Dr. Delorse LekPadgett on 02-13-17. [place] time.

## 2017-02-10 NOTE — Telephone Encounter (Signed)
Denied refill of Qnasl . Patient needs office visit

## 2017-02-10 NOTE — Telephone Encounter (Signed)
Sent in 1 month supply as pt needs and OV before and more refills

## 2017-02-13 ENCOUNTER — Encounter: Payer: Self-pay | Admitting: Allergy

## 2017-02-13 ENCOUNTER — Ambulatory Visit (INDEPENDENT_AMBULATORY_CARE_PROVIDER_SITE_OTHER): Payer: BLUE CROSS/BLUE SHIELD | Admitting: Allergy

## 2017-02-13 VITALS — BP 112/70 | HR 72 | Resp 16

## 2017-02-13 DIAGNOSIS — H1010 Acute atopic conjunctivitis, unspecified eye: Secondary | ICD-10-CM | POA: Diagnosis not present

## 2017-02-13 DIAGNOSIS — J309 Allergic rhinitis, unspecified: Secondary | ICD-10-CM

## 2017-02-13 MED ORDER — OLOPATADINE HCL 0.7 % OP SOLN: 1.0000 [drp] | Freq: Every day | OPHTHALMIC | 5 refills | Status: DC

## 2017-02-13 MED ORDER — BECLOMETHASONE DIPROPIONATE 80 MCG/ACT NA AERS: 2.0000 | INHALATION_SPRAY | Freq: Every day | NASAL | 5 refills | Status: DC

## 2017-02-13 MED ORDER — LEVOCETIRIZINE DIHYDROCHLORIDE 5 MG PO TABS: 5.0000 mg | ORAL_TABLET | Freq: Every evening | ORAL | 5 refills | Status: AC

## 2017-02-13 NOTE — Progress Notes (Signed)
Follow-up Note  RE: Kelsey Vargas MRN: [id number] DOB: 07-Sep-1961 Date of Office Visit: 02/13/2017   History of present illness: Kelsey Vargas is a 56 y.o. female presenting today for follow-up of allegic rhinoconjunctivitis.  She was last seen in the office by Dr. Beaulah Dinning on 02/20/2015.  She reports she has been doing well since this last visit without any major illnesses, surgeries or hospitalizations.    Pt has been on allergen immunotherapy since 12/31/15 and receives 2 injections weekly.   She has progressed to maintenance dose for one of the two shots an dis on green vial of the other. She reports that since she started the shots, she has been able to use levocetirizine, Qnasl, Pazeo, and Singulair only as needed, and reports a reduction in her symptoms overall. She has a nurse at her workplace who is able to administer the shots for her on a weekly basis. She denies any large local or systemic reactions.      Review of systems: Review of Systems  Constitutional: Negative for chills, fever and weight loss.  HENT: Negative for congestion and sore throat.   Eyes: Negative for discharge and redness.  Respiratory: Negative for cough, shortness of breath and wheezing.   Cardiovascular: Negative for chest pain.  Gastrointestinal: Negative for abdominal pain, diarrhea, heartburn, nausea and vomiting.  Musculoskeletal: Negative for joint pain and myalgias.  Skin: Negative for itching and rash.  Neurological: Negative for headaches.    All other systems negative unless noted above in HPI  Past medical/social/surgical/family history have been reviewed and are unchanged unless specifically indicated below.  No changes  Medication List: Allergies as of 02/13/2017      Reactions   Latex       Medication List       Accurate as of 02/13/17 11:52 AM. Always use your most recent med list.          Beclomethasone Dipropionate 80 MCG/ACT Aers Commonly known as:  QNASL Place 2 puffs  into the nose daily.   desonide 0.05 % cream Commonly known as:  DESOWEN APPLY TO AFFECTED AREAS AS NEEDED.   doxycycline 100 MG capsule Commonly known as:  VIBRAMYCIN Take 1 capsule (100 mg total) by mouth 2 (two) times daily. Take with food.   EPINEPHrine 0.3 mg/0.3 mL Soaj injection Commonly known as:  EPIPEN 2-PAK Inject 0.3 mLs (0.3 mg total) into the muscle once.   fluticasone 27.5 MCG/SPRAY nasal spray Commonly known as:  VERAMYST Place 1 spray into the nose as needed for rhinitis. Reported on 01/21/2016   hydrocortisone cream 1 % Apply 1 application topically as needed for itching.   levocetirizine 5 MG tablet Commonly known as:  XYZAL Take 1 tablet (5 mg total) by mouth every evening.   montelukast 10 MG tablet Commonly known as:  SINGULAIR Take 1 tablet (10 mg total) by mouth as needed.   NON FORMULARY   Olopatadine HCl 0.7 % Soln Commonly known as:  PAZEO Place 1 drop into both eyes daily.   predniSONE 20 MG tablet Commonly known as:  DELTASONE Take 1 tablet (20 mg total) by mouth 2 (two) times daily. Take with food.       Known medication allergies: Allergies  Allergen Reactions  . Latex      Physical examination: Blood pressure 112/70, pulse 72, resp. rate 16.  General: Alert, interactive, in no acute distress. HEENT: PERRLA, TMs pearly gray, turbinates non-edematous without discharge, post-pharynx unremarkable. Neck: Supple without lymphadenopathy. Lungs:  Clear to auscultation without wheezing, rhonchi or rales. {no increased work of breathing. CV: Normal S1, S2 without murmurs. Abdomen: Nondistended, nontender. Skin: Warm and dry, without lesions or rashes. Extremities:  No clubbing, cyanosis or edema. Neuro:   Grossly intact.  Diagnositics/Labs: Labs: none  Assessment and plan:   1. Allergic rhinoconjunctivitis -Symptoms are well-controlled at this point on allergen immunotherapy. Continue allergy shots per protocol.  Discussed will  need to continue once at maintenance dosing for 3-5 duration before considering stopping.   -Continue allergen avoidance measures. -Continue as needed use of your levocetirizine, Qnasl, and Pazeo. Discontinue use of Singulair. -Follow-up in one year or sooner if needed.   I appreciate the opportunity to take part in Kelsey Vargas's care. Please do not hesitate to contact me with questions.  Sincerely,   Margo AyeShaylar Padgett, MD Allergy/Immunology [facility]

## 2017-02-13 NOTE — Patient Instructions (Signed)
1. Allergic rhinoconjunctivitis -Symptoms are well-controlled at this point on allergy shots. Continue allergy shot per protocol. -Continue allergen avoidance measures. -Continue as needed use of your levocetirizine, Qnasl, and Pazeo. Discontinue use of Singulair. -Follow-up in one year or sooner if needed.

## 2017-03-26 ENCOUNTER — Ambulatory Visit (INDEPENDENT_AMBULATORY_CARE_PROVIDER_SITE_OTHER): Payer: BLUE CROSS/BLUE SHIELD | Admitting: *Deleted

## 2017-03-26 DIAGNOSIS — J309 Allergic rhinitis, unspecified: Secondary | ICD-10-CM

## 2017-03-26 NOTE — Progress Notes (Signed)
Immunotherapy   Patient Details  Name: Kelsey Vargas MRN: [id number] Date of Birth: 07/02/1961  03/26/2017  Kelsey PrestoPatricia Vargas here to pick up  Red vial 1:100 - Grass-Tree-Cat-Dog. Following schedule: A  Frequency:1 time per week Epi-Pen:Epi-Pen Available  Consent signed and patient instructions given. No problem after 30 minutes in office. Patient receives injections from her nurse at work. Garwin BrothersAnn Durbin, RN.    Vella RedheadHeather Clark 03/26/2017, 6:39 PM

## 2017-03-31 NOTE — Progress Notes (Signed)
VIALS EXP- 04/03/18 

## 2017-03-31 NOTE — Progress Notes (Signed)
VIAL EXP- 04/03/18

## 2017-04-03 DIAGNOSIS — J301 Allergic rhinitis due to pollen: Secondary | ICD-10-CM | POA: Diagnosis not present

## 2017-04-16 ENCOUNTER — Ambulatory Visit (INDEPENDENT_AMBULATORY_CARE_PROVIDER_SITE_OTHER): Payer: BLUE CROSS/BLUE SHIELD | Admitting: *Deleted

## 2017-04-16 DIAGNOSIS — J309 Allergic rhinitis, unspecified: Secondary | ICD-10-CM | POA: Diagnosis not present

## 2017-04-16 NOTE — Progress Notes (Signed)
Immunotherapy   Patient Details  Name: Kelsey Vargas MRN: [id number] Date of Birth: 05/24/1961  04/16/2017  Kelsey Vargas here to pick up  Red #2 1:00 Weed-Mold Following schedule: A  Frequency:1 time per week Epi-Pen:Epi-Pen Available  Consent signed and patient instructions given.   Bennye AlmMildred Jisell Majer 04/16/2017, 6:46 PM

## 2017-07-03 ENCOUNTER — Encounter: Payer: Self-pay | Admitting: *Deleted

## 2017-07-03 DIAGNOSIS — J301 Allergic rhinitis due to pollen: Secondary | ICD-10-CM | POA: Diagnosis not present

## 2017-07-03 NOTE — Progress Notes (Signed)
Maintenance vial made. Exp: 07/03/18. hc

## 2017-07-09 ENCOUNTER — Ambulatory Visit (INDEPENDENT_AMBULATORY_CARE_PROVIDER_SITE_OTHER): Payer: BLUE CROSS/BLUE SHIELD | Admitting: *Deleted

## 2017-07-09 DIAGNOSIS — J309 Allergic rhinitis, unspecified: Secondary | ICD-10-CM | POA: Diagnosis not present

## 2017-07-09 NOTE — Progress Notes (Signed)
Immunotherapy   Patient Details  Name: Vivien Prestoatricia Shaff MRN: [id number] Date of Birth: [birth year]  07/09/2017  Vivien PrestoPatricia Face here to pick up  Red vial 1:100 Grass-Tree-Cat-Dog & Weed-Mold. Following schedule: A  Frequency:1 time per week Epi-Pen:Epi-Pen Available  Consent signed and patient instructions given. No problems after 30 minutes in the office. Patient receives injections for nurse at [employer] - Ann Durbin, RN. No problems with previous vials. Shot record scanned.   Vella RedheadHeather Clark 07/09/2017, 6:49 PM

## 2017-09-04 ENCOUNTER — Encounter (HOSPITAL_COMMUNITY): Payer: Self-pay

## 2017-09-22 ENCOUNTER — Telehealth: Payer: Self-pay | Admitting: Allergy

## 2017-09-22 MED ORDER — EPINEPHRINE 0.3 MG/0.3ML IJ SOAJ: INTRAMUSCULAR | 1 refills | Status: AC

## 2017-09-22 NOTE — Telephone Encounter (Signed)
Pt called and needs a epi-pen called into [facility]. [phone].

## 2017-09-22 NOTE — Telephone Encounter (Signed)
rx refill sent in  

## 2017-10-20 DIAGNOSIS — J301 Allergic rhinitis due to pollen: Secondary | ICD-10-CM | POA: Diagnosis not present

## 2017-10-21 ENCOUNTER — Encounter: Payer: Self-pay | Admitting: *Deleted

## 2017-10-21 NOTE — Progress Notes (Signed)
Maintenance vial made. Exp: 10-21-18. hv 

## 2017-10-22 ENCOUNTER — Ambulatory Visit: Payer: Self-pay

## 2017-10-23 ENCOUNTER — Ambulatory Visit (INDEPENDENT_AMBULATORY_CARE_PROVIDER_SITE_OTHER): Payer: BLUE CROSS/BLUE SHIELD

## 2017-10-23 DIAGNOSIS — J309 Allergic rhinitis, unspecified: Secondary | ICD-10-CM | POA: Diagnosis not present

## 2021-04-10 ENCOUNTER — Other Ambulatory Visit: Payer: Self-pay | Admitting: Radiology

## 2021-04-10 DIAGNOSIS — N63 Unspecified lump in unspecified breast: Secondary | ICD-10-CM

## 2021-04-19 ENCOUNTER — Other Ambulatory Visit: Payer: Self-pay

## 2021-04-19 ENCOUNTER — Ambulatory Visit
Admission: RE | Admit: 2021-04-19 | Discharge: 2021-04-19 | Disposition: A | Discharge status: Home / Self Care | Payer: BLUE CROSS/BLUE SHIELD | Source: Ambulatory Visit | Attending: Radiology | Admitting: Radiology

## 2021-04-19 DIAGNOSIS — N63 Unspecified lump in unspecified breast: Secondary | ICD-10-CM

## 2022-02-11 ENCOUNTER — Other Ambulatory Visit: Payer: Self-pay

## 2022-04-10 DIAGNOSIS — M8580 Other specified disorders of bone density and structure, unspecified site: Secondary | ICD-10-CM | POA: Diagnosis not present

## 2022-05-07 DIAGNOSIS — H524 Presbyopia: Secondary | ICD-10-CM | POA: Diagnosis not present

## 2022-05-07 DIAGNOSIS — H5213 Myopia, bilateral: Secondary | ICD-10-CM | POA: Diagnosis not present

## 2023-06-30 IMAGING — MG MM DIGITAL DIAGNOSTIC UNILAT*L* W/ TOMO W/ CAD
6 series · 6 of 18 positions shown · non-contrast
Comparison: Previous exams including most recent bilateral
screening mammogram dated 11/01/2020.

CLINICAL DATA: Patient describes a palpable lump/thickening in the
upper-outer quadrant of the LEFT breast.

EXAM:
DIGITAL DIAGNOSTIC UNILATERAL LEFT MAMMOGRAM WITH TOMOSYNTHESIS AND
CAD; ULTRASOUND LEFT BREAST LIMITED
TECHNIQUE: Left digital diagnostic mammography and breast tomosynthesis was
performed. The images were evaluated with computer-aided detection.;
Targeted ultrasound examination of the left breast was performed.

[L MLO synth-2D (1 of 2)]
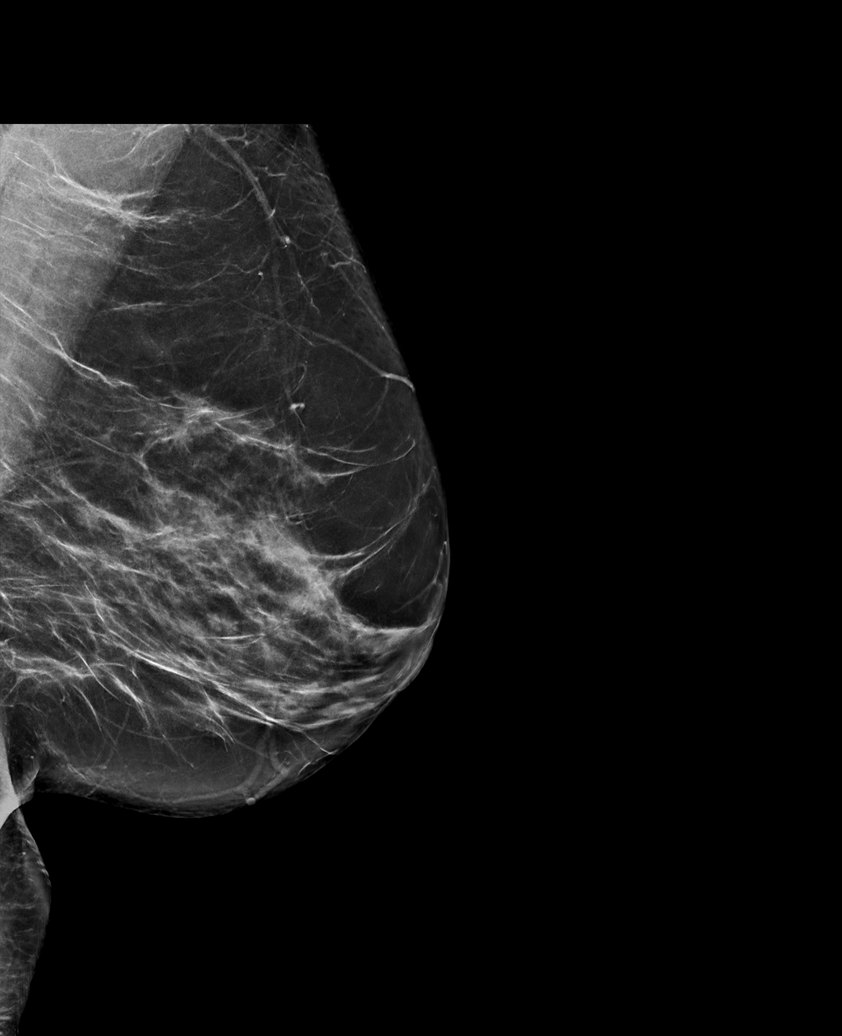

[L MLO synth-2D (2 of 2)]
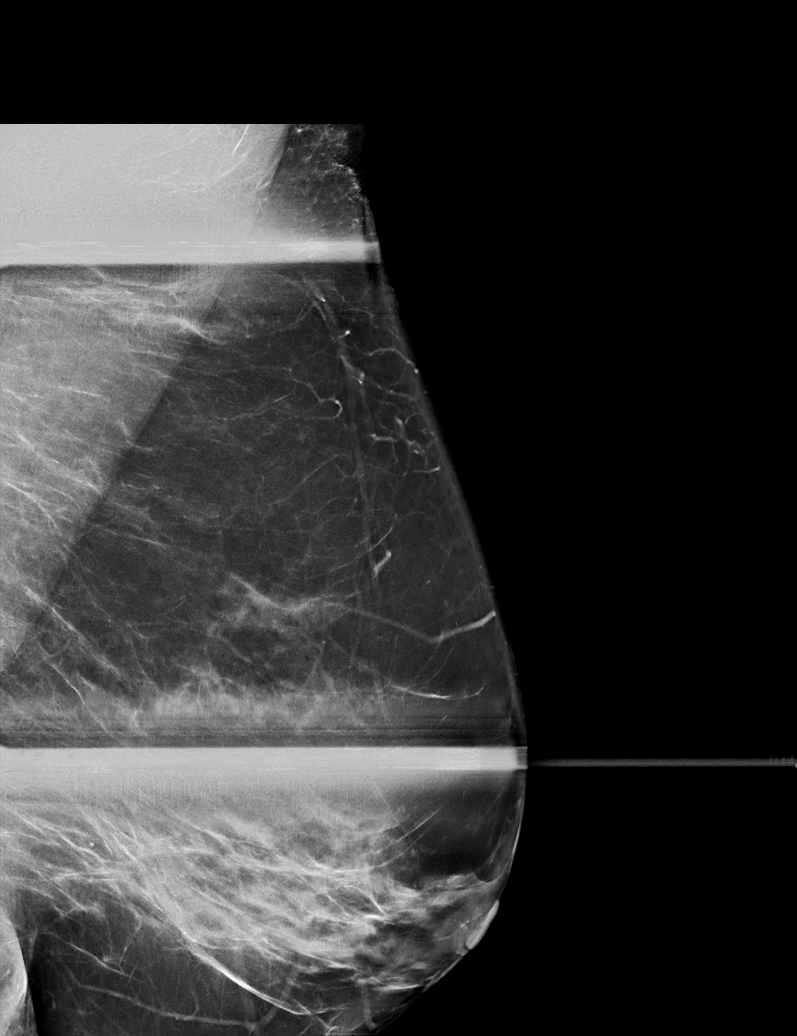

[L CC synth-2D]
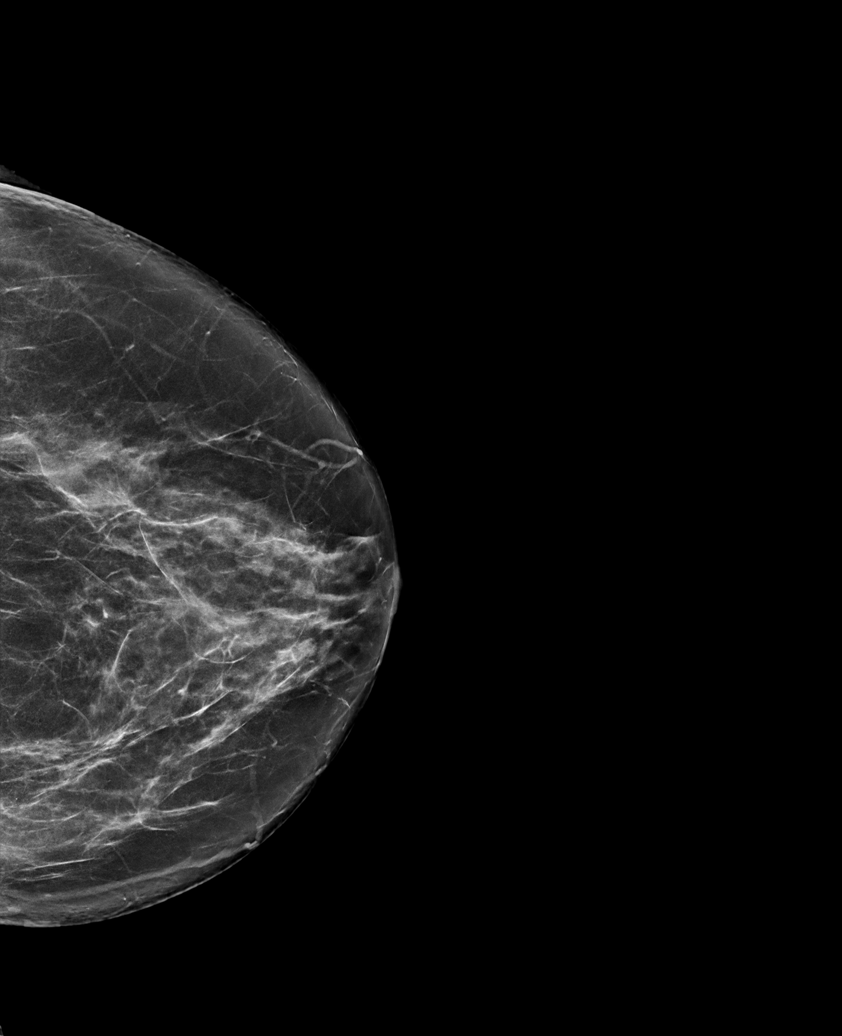

[L CC tomo · tomo slice 39/76.0]
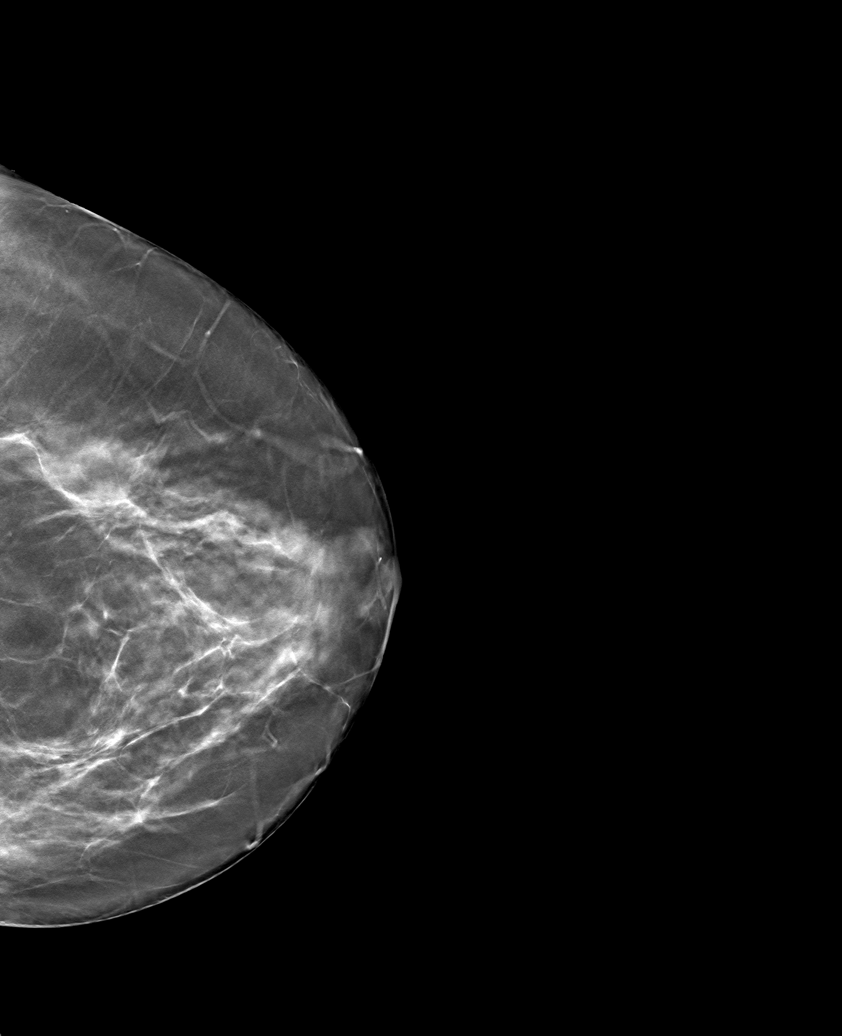

[L MLO tomo (1 of 2) · tomo slice 41/82.0]
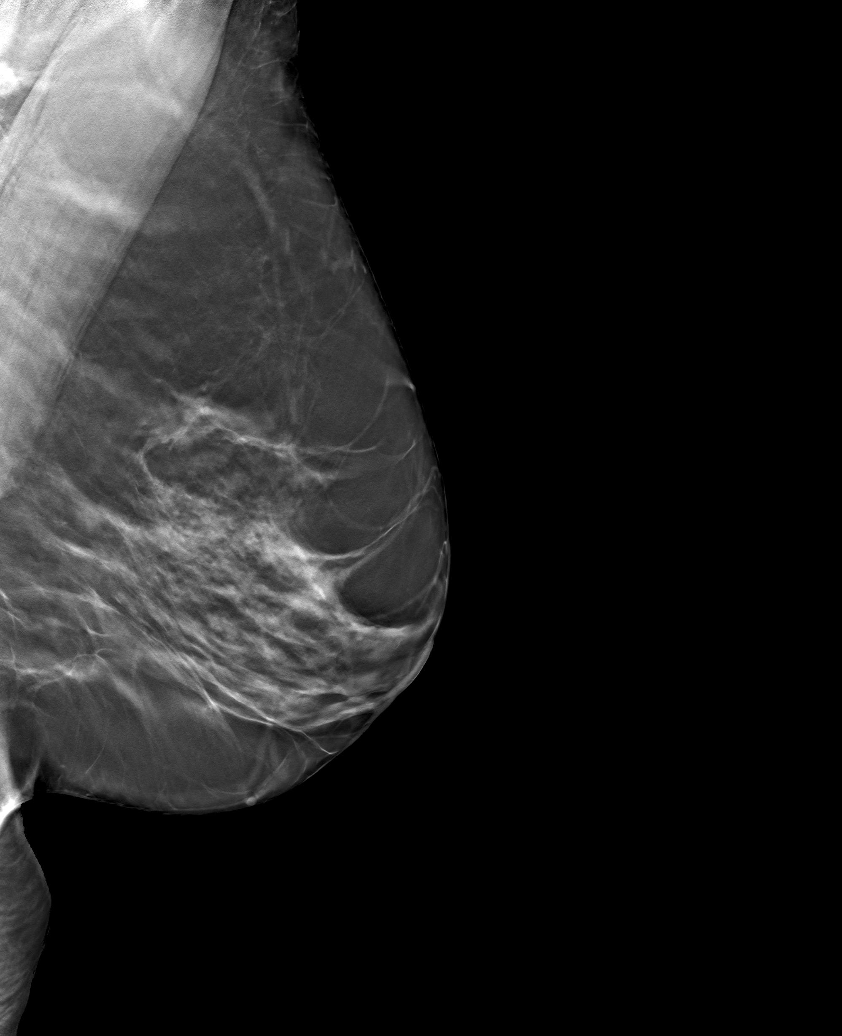

[L MLO tomo (2 of 2) · tomo slice 43/86.0]
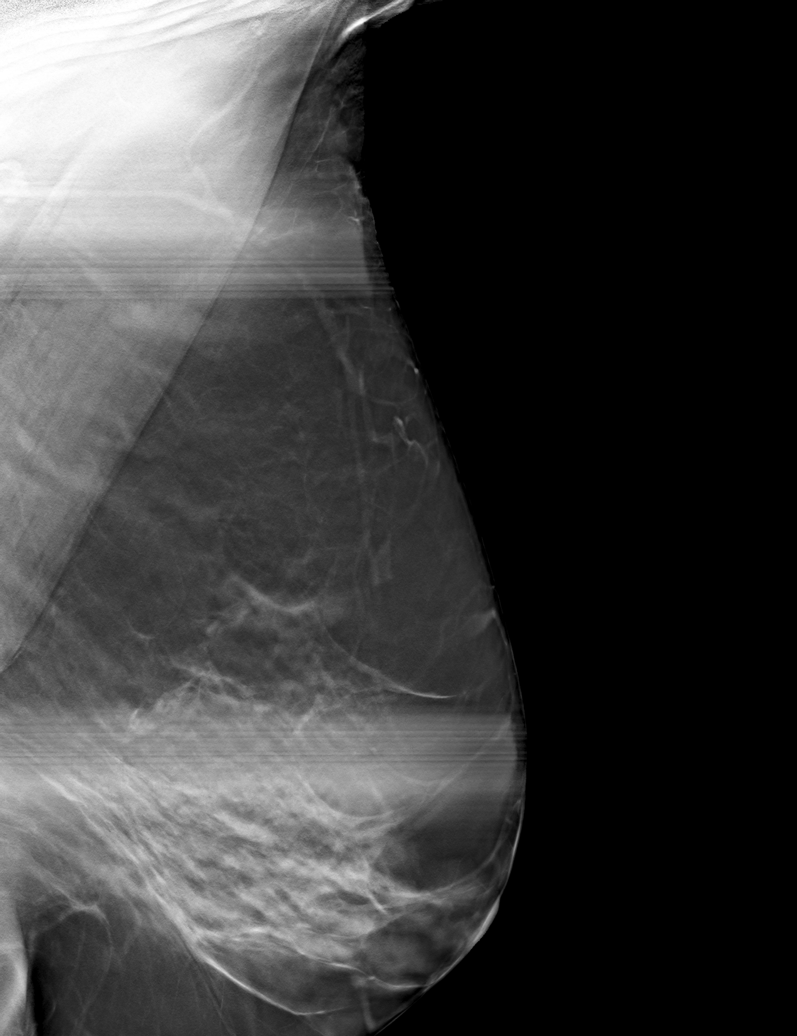

[6 of 18 positions shown; findings below may reference images not displayed]

ACR Breast Density Category c: The breast tissue is heterogeneously
dense, which may obscure small masses.
FINDINGS: LEFT breast diagnostic mammogram: There are no new dominant masses,
suspicious calcifications or secondary signs of malignancy within
the LEFT breast.

Targeted ultrasound is performed, evaluating the upper-outer
quadrant of the LEFT breast as directed by the patient, showing only
normal fibroglandular tissues and fat lobules throughout. No solid
or cystic mass. There is a ridge of normal dense fibroglandular
tissue at the 1-2 o'clock axis, corresponding to the palpable area
of concern.
IMPRESSION: No evidence of malignancy.

RECOMMENDATION:
1. Annual screening mammograms. Next bilateral screening mammogram
will be due in Saturday October, 2021.
2. The patient was instructed to return sooner if the area that she
feels becomes larger and/or firmer to palpation, or if a new
palpable abnormality is identified in either breast.

I have discussed the findings and recommendations with the patient.
If applicable, a reminder letter will be sent to the patient
regarding the next appointment.

BI-RADS CATEGORY  1: Negative.

## 2024-10-07 ENCOUNTER — Encounter: Payer: Self-pay | Admitting: Internal Medicine

## 2024-10-07 ENCOUNTER — Telehealth: Payer: Self-pay | Admitting: Internal Medicine

## 2024-10-07 ENCOUNTER — Other Ambulatory Visit: Payer: Self-pay

## 2024-10-07 ENCOUNTER — Ambulatory Visit: Admitting: Internal Medicine

## 2024-10-07 VITALS — BP 118/74 | HR 88 | Temp 98.0°F | Resp 18 | Ht 62.0 in | Wt 165.4 lb

## 2024-10-07 DIAGNOSIS — H1010 Acute atopic conjunctivitis, unspecified eye: Secondary | ICD-10-CM

## 2024-10-07 DIAGNOSIS — L309 Dermatitis, unspecified: Secondary | ICD-10-CM

## 2024-10-07 DIAGNOSIS — J309 Allergic rhinitis, unspecified: Secondary | ICD-10-CM | POA: Diagnosis not present

## 2024-10-07 DIAGNOSIS — J383 Other diseases of vocal cords: Secondary | ICD-10-CM | POA: Diagnosis not present

## 2024-10-07 MED ORDER — IPRATROPIUM BROMIDE 0.03 % NA SOLN: 2.0000 | Freq: Three times a day (TID) | NASAL | 5 refills | Status: AC

## 2024-10-07 MED ORDER — OLOPATADINE HCL 0.7 % OP SOLN: 1.0000 [drp] | Freq: Every day | OPHTHALMIC | 5 refills | Status: AC | PRN

## 2024-10-07 MED ORDER — MONTELUKAST SODIUM 10 MG PO TABS: 10.0000 mg | ORAL_TABLET | Freq: Every day | ORAL | 5 refills | Status: AC

## 2024-10-07 MED ORDER — QNASL 80 MCG/ACT NA AERS: 2.0000 | INHALATION_SPRAY | Freq: Every day | NASAL | 5 refills | Status: AC

## 2024-10-07 MED ORDER — LORATADINE 10 MG PO CAPS: 10.0000 mg | ORAL_CAPSULE | Freq: Every day | ORAL | 5 refills | Status: AC | PRN

## 2024-10-07 MED ORDER — EUCRISA 2 % EX OINT: TOPICAL_OINTMENT | CUTANEOUS | 3 refills | Status: AC

## 2024-10-07 NOTE — Progress Notes (Signed)
 "  NEW PATIENT Date of Service/Encounter:   10/07/2024 Referring provider: Boneta Melodi BRAVO, PA-C Primary care provider: Boneta Ulyess BRAVO, PA-C  Subjective:  Kelsey Vargas is a 64 y.o. female presenting today for evaluation of allergic rhinitis and vocal cord dysfunction. History obtained from: chart review and patient.   Discussed the use of AI scribe software for clinical note transcription with the patient, who gave verbal consent to proceed.  History of Present Illness Kelsey Vargas is a 64 year old female with allergies who presents with exacerbated allergy symptoms and vocal cord sensitivity.  Allergic rhinitis and ocular symptoms - Perennial symptoms without seasonal variation - Post-nasal drip - Swollen and puffy eyes - Rash under the eyes - Episodes of excessive tearing causing discomfort and requiring urgent care visits for eye rinsing - Occasionally uses half a tablet of cetirizine due to sedative effects - Applies Eucrisa for rash under the eyes - Loratadine effective during the day without drowsiness - Levocetirizine causes grogginess - Prefers Qnasl  nasal spray over wet sprays like Flonase  - Previously used montelukast  without notable side effects - all medications now expired  Vocal cord sensitivity and environmental triggers - Sensitivity to smells triggers vocal cord irritation - Difficulty being around certain scents such as colognes and coffee - Work microbiologist; 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 Jeneal with last visit in 2018.  Stopped allergy injections in February 2019. Pt had been on allergen immunotherapy since 12/31/15 and received 2 injections weekly.   Doing well with injections without reported side effects and significant improvement in symptoms reported.  Her AIT included vial 1: G/T/C/D and vial 2: W/RW/M.    Past Medical History: No past medical history on file. Medication List:  Current Outpatient Medications  Medication Sig Dispense Refill   Beclomethasone Dipropionate  (QNASL ) 80 MCG/ACT AERS Place 2 puffs into the nose daily. 8.7 g 5   desonide  (DESOWEN ) 0.05 % cream APPLY TO AFFECTED AREAS AS NEEDED. 30 g 3   doxycycline  (VIBRAMYCIN ) 100 MG capsule Take 1 capsule (100 mg total) by mouth 2 (two) times daily. Take with food. 14 capsule 0   EPINEPHrine  (EPIPEN  2-PAK) 0.3 mg/0.3 mL IJ SOAJ injection Use as directed for severe allergic reaction 2 Device 1   EPINEPHrine  0.3 mg/0.3 mL IJ SOAJ injection Inject into the muscle.     fluticasone  (VERAMYST) 27.5 MCG/SPRAY nasal spray Place 1 spray into the nose as needed for rhinitis. Reported on 01/21/2016     hydrocortisone cream 1 % Apply 1 application topically as needed for itching.     levocetirizine (XYZAL ) 5 MG tablet Take 1 tablet (5 mg total) by mouth every evening. 30 tablet 5   montelukast  (SINGULAIR ) 10 MG tablet Take 1 tablet (10 mg total) by mouth as needed. 30 tablet 5   NON FORMULARY      Olopatadine  HCl (PAZEO) 0.7 % SOLN Place 1 drop into both eyes daily. 1 Bottle 5   predniSONE  (DELTASONE ) 20 MG tablet Take  1 tablet (20 mg total) by mouth 2 (two) times daily. Take with food. 10 tablet 0   No current facility-administered medications for this visit.   Known Allergies:  Allergies[1] Past Surgical History: Past Surgical History:  Procedure Laterality Date   UTERINE ARTERY EMBOLIZATION      Family History: Family History  Problem Relation Age of Onset   Cancer Mother        lung   Hypertension Mother    Hypertension Father    Cancer Brother        colon   Cancer Maternal Grandmother        colon   Hyperlipidemia Sister    Hypertension Sister    Stroke Sister    Social History: Kelsey Vargas lives in a house built 24 years ago, no water damage, carpet in the bedroom, electrocuting, central AC, has a small rescue dog inside the home, no roaches, using dust mite covers on the bed and the pillows, no smoke exposure.  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  She does not smoke and has no secondhand smoke exposure.   ROS:  All other systems negative except as noted per HPI.  Objective:  Blood pressure 118/74, pulse 88, temperature 98 F (36.7 C), temperature source Temporal, resp. rate 18, height 5' 2 (1.575 m), weight 165 lb 6.4 oz (75 kg), SpO2 98%. Body mass index is 30.25 kg/m. Physical Exam:  General Appearance:  Alert, cooperative, no distress, appears stated age  Head:  Normocephalic, without obvious abnormality, atraumatic  Eyes:  Conjunctiva clear, EOM's intact  Ears EACs normal bilaterally and normal TMs bilaterally  Nose: Nares normal, hypertrophic turbinates, normal mucosa, and no visible anterior polyps  Throat: Lips, tongue normal; teeth and gums normal, normal posterior oropharynx  Neck: Supple, symmetrical  Lungs:   clear to auscultation bilaterally, Respirations unlabored, no coughing  Heart:  regular rate and rhythm and no murmur, Appears well perfused  Extremities: No edema  Skin: Skin color, texture, turgor normal and no rashes or lesions on visualized portions of skin  Neurologic: No gross deficits   Diagnostics:  Labs:  Lab Orders  No laboratory test(s) ordered today     Assessment and Plan  Assessment and Plan Assessment & Plan Allergic rhinitis with perennial  symptoms Chronic allergic rhinitis with perennial symptoms, including post-nasal drip and nasal congestion, exacerbated by environmental allergens. Previous allergy shots were beneficial, but she discontinued due to relocation and insurance issues. Current medications have expired, and she experiences significant discomfort due to nasal drainage. - Prescribed Qnasl  nasal spray for nasal congestion. 2 sprays daily as needed - Prescribed Atrovent nasal spray for drainage control. 1-2 spray in each nostril twice daily a needed - continue loratadine 10 mg daily - restart montelukast  10 mg nightly, stop if nightmares or behavior changes - Scheduled skin prick test for allergy testing next Friday. - Plan to restart allergy immunotherapy with standard buildup due to fear of needles.  Allergic conjunctivitis with periorbital swelling Chronic allergic conjunctivitis with periorbital swelling, exacerbated by environmental allergens. Symptoms include watery eyes, swelling, and redness under the eyes. Previous treatments included loratadine and montelukast , but loratadine causes drowsiness. - Continue montelukast  10 mg daily. - Prescribed Pataday  for eye symptoms. 1 drop in each eye daily as needed  Suspected allergic contact dermatitis of periorbital area Suspected allergic contact dermatitis of the periorbital area, possibly due to environmental allergens or contact with certain products. Symptoms  include rash and swelling under the eyes, potentially exacerbated by watery eyes and environmental irritants. - Will consider patch testing to identify potential allergens causing contact dermatitis. - schedule patch testing at your convenience - placement will be in our [place] office on a Monday, removal on Wednesday in [place] office with first read, final read on a Friday - must keep back dry during patch tesitng  Follow up : next Friday January 30th at 3:30 PM (1-55) must stop antihistamines 3 days prior to  visit (loratadine) It was a pleasure meeting you in clinic today! Thank you for allowing me to participate in your care.  Rocky Endow, MD [facility]    This note in its entirety was forwarded to the Provider who requested this consultation.  Other: none  Thank you for your kind referral. I appreciate the opportunity to take part in Kelsey Vargas's care. Please do not hesitate to contact me with questions.  Sincerely,  Rocky Endow, MD [facility]          [1]  Allergies Allergen Reactions   Latex    "

## 2024-10-07 NOTE — Telephone Encounter (Signed)
 [employer] called and stated that Q nasal aresol 80mcg is excluded from this patients plan, and that we can try to appeal or do an alternative which would be fluticasone . provider line [phone] if questions, and she asked that we let the patient know when it has been taken care of.

## 2024-10-07 NOTE — Telephone Encounter (Signed)
 Yes, she refuses flonase , nasacort or nasonex stating does not tolerate. She would like Qnasal only. Can we appeal?

## 2024-10-07 NOTE — Telephone Encounter (Signed)
 Pt seen in clinic today and it states in note that she prefers Qnasl  nasal spray over wet sprays like Flonase .  Dr Marinda- what would you like to do?

## 2024-10-07 NOTE — Patient Instructions (Addendum)
 Allergic rhinitis with perennial symptoms Chronic allergic rhinitis with perennial symptoms, including post-nasal drip and nasal congestion, exacerbated by environmental allergens. Previous allergy shots were beneficial, but she discontinued due to relocation and insurance issues. Current medications have expired, and she experiences significant discomfort due to nasal drainage. - Prescribed Qnasl  nasal spray for nasal congestion. 2 sprays daily as needed - Prescribed Atrovent nasal spray for drainage control. 1-2 spray in each nostril twice daily a needed - continue loratadine 10 mg daily - restart montelukast  10 mg nightly, stop if nightmares or behavior changes - Scheduled skin prick test for allergy testing next Friday. - Plan to restart allergy immunotherapy with standard buildup due to fear of needles.  Allergic conjunctivitis with periorbital swelling Chronic allergic conjunctivitis with periorbital swelling, exacerbated by environmental allergens. Symptoms include watery eyes, swelling, and redness under the eyes. Previous treatments included loratadine and montelukast , but loratadine causes drowsiness. - Continue montelukast  10 mg daily. - Prescribed Pataday  for eye symptoms. 1 drop in each eye daily as needed  Suspected allergic contact dermatitis of periorbital area Suspected allergic contact dermatitis of the periorbital area, possibly due to environmental allergens or contact with certain products. Symptoms include rash and swelling under the eyes, potentially exacerbated by watery eyes and environmental irritants. - Will consider patch testing to identify potential allergens causing contact dermatitis. - schedule patch testing at your convenience - placement will be in our [place] office on a Monday, removal on Wednesday in [place] office with first read, final read on a Friday - must keep back dry during patch tesitng - eucrisa twice daily as needed on rash under eyes when  occurs  Laryngeal spasm/vocal cord dysfunction (VCD): - uncontrolled reflux and post-nasal drainage can make VCD worse and can even be the sole cause of symptoms - will focus on drainage and allergic rhinitis control at this time - consider reflux if symptoms not improving - try VCD exercises (see information packet provided) first for breathing symptoms - consider ENT referral to ensure no other vocal cord abnormalities if  symptoms not controlled with adequate allergy management   Follow up : next Friday January 30th at 3:30 PM (1-55) must stop antihistamines 3 days prior to visit (loratadine) It was a pleasure meeting you in clinic today! Thank you for allowing me to participate in your care.  Rocky Endow, MD [facility]  Vocal Cord Dysfunction (VCD) /Inspiratory Laryngeal Obstruction (ILO)/ Exercise- Induced Laryngeal Obstruction Exercises (EILO)  Practice these techniques several times a day, so that when you need them, they will be automatic, and will work right away (or very fast) to open up the spasming (closed) vocal cords.  PREPARATION: ? Loosen clothing at waist, so nothing is tight or snug. Open top buttons of pants or skirt, unzip pant or skirt zippers, pull shirt out over pants or skirt. This prevents pressure on the stomach, which can cause stomach reflux (backup of corrosive liquid) into the esophagus. Gastric reflux is a frequent cause of VCD attacks. ? Sit in a comfortable chair. When you need to use these techniques, you may be standing, lying, or sitting-BUT first try to learn them while sitting because they are easier to learn in this position. ? Keep water handy. Take sips, so your mouth and throat will not dry out. Swallow carefully, to avoid choking. ? Put your right (or left) thumb on your belly button, with the rest of your fingers below the thumb, so you are GENTLY touching your  belly (lower abdomen). ? Doing this will focus your  attention on your lower abdominal muscles. ? Relax your chest. Relax your shoulders. Relax your neck. Relax your throat. This helps you to try to use ONLY your belly (lower abdomen) muscles. Consciously try NOT to use chest muscles, etc. Chest muscle use seems to irritate vocal cords while belly muscles tend to relax them.  BREATHE OUT (EXHALE) FIRST WITH slightly PURSED LIPS: ? Since most people cannot inhale, (breathe in), during VCD attacks, please start by breathing out (exhaling) in the special way described below, and this will usually open up the vocal cords, immediately. ? Start, by breathing out (exhaling) with lips pursed as if you are trying to gently blow out a candle. ? This is similar to whistling, but your lips will not be as puckered as when whistling and your lips will not be pushed forward, like when whistling. If you look in a mirror, you would see a mostly horizontal line of space between the lips, while you exhale the ffffffffffffffffffff. ? This may be silent, or may sound like a gentle wind or breeze, like fffffffffffffff and your lips should be symmetrical, around your teeth. You lower lip will not be touching your upper teeth, like when someone says the name FFFFFFFRank. This is one continuous flow of exhaled air, either silent, or making a slightly windy sound. ? Feel your hand on your belly (lower abdomen) come IN, a little bit toward your back, as you are working only your lower belly muscles. ? This abdominal exhaled fffffffffffffffff alone often stops VCD attacks very quickly.  ? Some prefer to try a variation of exhaling ffffffffffff. Try exhaling quickly, ffff, fffff, ffff--all part of one exhalation. Do not inhale in between quick fffs. This helps some people more quickly open up the spasming vocal cords. This is like blowing out a slightly stubborn candle, exhaling against a tiny bit of resistance (like trying to blow out a trick birthday  candle). ? If any of this helps, try to slow down the exhalations, and gently exhale ffffffffffffffffff. ? Some people prefer to exhale gently ssssssssssssssss or shhhhhhhhhhhhh rather than fffffffffffffff, etc. Choose what works best in your case, or what is most comfortable for you. ? You may need to repeat exhaling ffffffffffffff (or ffff, ffff, ffff), etc, several times to relax the spasming vocal cords. Repeating this often stops a VCD attack so that you can inhale (breathe in) again.

## 2024-10-12 ENCOUNTER — Telehealth: Payer: Self-pay

## 2024-10-12 ENCOUNTER — Other Ambulatory Visit (HOSPITAL_COMMUNITY): Payer: Self-pay

## 2024-10-12 NOTE — Telephone Encounter (Signed)
 Resubmitted prior Auth for Qnasl   (Key: [id number])  Your information has been sent to [facility].

## 2024-10-12 NOTE — Telephone Encounter (Signed)
 Denied today by [employer]

## 2024-10-12 NOTE — Telephone Encounter (Signed)
 Key: [id number]

## 2024-10-12 NOTE — Telephone Encounter (Signed)
 Office resubmitted under Key:

## 2024-10-12 NOTE — Telephone Encounter (Signed)
 This request was denied because you did not meet the following requirements: The requested medication and/or diagnosis are not a covered benefit and excluded from coverage in accordance with the terms and conditions of your plan benefit. Therefore, the request has been administratively denied. If applicable, below are some alternative medications to consider: FLUTICASONE  PROPIONATE

## 2024-10-12 NOTE — Telephone Encounter (Signed)
 Can you let the patient know that we submitted a prior authorization for her QNASAL but it was denied. She may consider reaching out to her insurance.

## 2024-10-12 NOTE — Telephone Encounter (Signed)
 The requested medication and/or diagnosis are not a covered benefit and excluded from coverage in accordance with the terms and conditions of your plan benefit. Therefore, the request has been administratively denied. If applicable, below are some alternative medications to consider: FLUTICASONE  PROPIONATE

## 2024-10-12 NOTE — Telephone Encounter (Signed)
*  AA  Pharmacy Patient Advocate Encounter   Received notification from Pt Calls Messages that prior authorization for Qnasl  80MCG/ACT aerosol   is required/requested.   Insurance verification completed.   The patient is insured through [facility].   Per test claim: PA required; PA submitted to above mentioned insurance via Latent Key/confirmation #/[id number] [facility] Status is pending

## 2024-10-14 ENCOUNTER — Ambulatory Visit: Admitting: Internal Medicine

## 2024-10-14 ENCOUNTER — Encounter: Payer: Self-pay | Admitting: Internal Medicine

## 2024-10-14 DIAGNOSIS — J3089 Other allergic rhinitis: Secondary | ICD-10-CM | POA: Diagnosis not present

## 2024-10-14 DIAGNOSIS — J302 Other seasonal allergic rhinitis: Secondary | ICD-10-CM

## 2024-10-14 NOTE — Patient Instructions (Signed)
 Allergic rhinitis with perennial symptoms Chronic allergic rhinitis with perennial symptoms, including post-nasal drip and nasal congestion, exacerbated by environmental allergens. Previous allergy shots were beneficial, but she discontinued due to relocation and insurance issues. Current medications have expired, and she experiences significant discomfort due to nasal drainage. - Prescribed Qnasl  nasal spray for nasal congestion. 2 sprays daily as needed - Prescribed Atrovent  nasal spray for drainage control. 1-2 spray in each nostril twice daily a needed - continue loratadine  10 mg daily - restarted montelukast  10 mg nightly, stop if nightmares or behavior changes - Skin prick test 10/14/24: positive to grass pollen, tree pollen, weed pollen, indoor and outdoor molds, intradermal testing positive to cat and dogs; allergen avoidance.  - Plan to restart allergy immunotherapy with standard buildup due to fear of needles.  Allergic conjunctivitis with periorbital swelling Chronic allergic conjunctivitis with periorbital swelling, exacerbated by environmental allergens. Symptoms include watery eyes, swelling, and redness under the eyes. Previous treatments included loratadine  and montelukast , but loratadine  causes drowsiness. - Continue montelukast  10 mg daily. - Prescribed Pataday  for eye symptoms. 1 drop in each eye daily as needed  Suspected allergic contact dermatitis of periorbital area Suspected allergic contact dermatitis of the periorbital area, possibly due to environmental allergens or contact with certain products. Symptoms include rash and swelling under the eyes, potentially exacerbated by watery eyes and environmental irritants. - Will consider patch testing to identify potential allergens causing contact dermatitis. - schedule patch testing at your convenience - placement will be in our [place] office on a Monday, removal on Wednesday in [place] office with first read, final read on a  Friday - must keep back dry during patch tesitng - eucrisa  twice daily as needed on rash under eyes when occurs  Laryngeal spasm/vocal cord dysfunction (VCD): - uncontrolled reflux and post-nasal drainage can make VCD worse and can even be the sole cause of symptoms - will focus on drainage and allergic rhinitis control at this time - consider reflux if symptoms not improving - try VCD exercises (see information packet provided) first for breathing symptoms - consider ENT referral to ensure no other vocal cord abnormalities if  symptoms not controlled with adequate allergy management   Follow up : 3 months after starting your allergy injection, sooner if needed. Call insurance regarding coverage then call us  back to get scheduled.  It was a pleasure seeing you again in clinic today! Thank you for allowing me to participate in your care.  Rocky Endow, MD [facility]  Vocal Cord Dysfunction (VCD) /Inspiratory Laryngeal Obstruction (ILO)/ Exercise- Induced Laryngeal Obstruction Exercises (EILO)  Practice these techniques several times a day, so that when you need them, they will be automatic, and will work right away (or very fast) to open up the spasming (closed) vocal cords.  PREPARATION: ? Loosen clothing at waist, so nothing is tight or snug. Open top buttons of pants or skirt, unzip pant or skirt zippers, pull shirt out over pants or skirt. This prevents pressure on the stomach, which can cause stomach reflux (backup of corrosive liquid) into the esophagus. Gastric reflux is a frequent cause of VCD attacks. ? Sit in a comfortable chair. When you need to use these techniques, you may be standing, lying, or sitting-BUT first try to learn them while sitting because they are easier to learn in this position. ? Keep water handy. Take sips, so your mouth and throat will not dry out. Swallow carefully, to avoid choking. ? Put your  right (or left) thumb on your belly  button, with the rest of your fingers below the thumb, so you are GENTLY touching your belly (lower abdomen). ? Doing this will focus your attention on your lower abdominal muscles. ? Relax your chest. Relax your shoulders. Relax your neck. Relax your throat. This helps you to try to use ONLY your belly (lower abdomen) muscles. Consciously try NOT to use chest muscles, etc. Chest muscle use seems to irritate vocal cords while belly muscles tend to relax them.  BREATHE OUT (EXHALE) FIRST WITH slightly PURSED LIPS: ? Since most people cannot inhale, (breathe in), during VCD attacks, please start by breathing out (exhaling) in the special way described below, and this will usually open up the vocal cords, immediately. ? Start, by breathing out (exhaling) with lips pursed as if you are trying to gently blow out a candle. ? This is similar to whistling, but your lips will not be as puckered as when whistling and your lips will not be pushed forward, like when whistling. If you look in a mirror, you would see a mostly horizontal line of space between the lips, while you exhale the ffffffffffffffffffff. ? This may be silent, or may sound like a gentle wind or breeze, like fffffffffffffff and your lips should be symmetrical, around your teeth. You lower lip will not be touching your upper teeth, like when someone says the name FFFFFFFRank. This is one continuous flow of exhaled air, either silent, or making a slightly windy sound. ? Feel your hand on your belly (lower abdomen) come IN, a little bit toward your back, as you are working only your lower belly muscles. ? This abdominal exhaled fffffffffffffffff alone often stops VCD attacks very quickly.  ? Some prefer to try a variation of exhaling ffffffffffff. Try exhaling quickly, ffff, fffff, ffff--all part of one exhalation. Do not inhale in between quick fffs. This helps some people more quickly open up the spasming vocal  cords. This is like blowing out a slightly stubborn candle, exhaling against a tiny bit of resistance (like trying to blow out a trick birthday candle). ? If any of this helps, try to slow down the exhalations, and gently exhale ffffffffffffffffff. ? Some people prefer to exhale gently ssssssssssssssss or shhhhhhhhhhhhh rather than fffffffffffffff, etc. Choose what works best in your case, or what is most comfortable for you. ? You may need to repeat exhaling ffffffffffffff (or ffff, ffff, ffff), etc, several times to relax the spasming vocal cords. Repeating this often stops a VCD attack so that you can inhale (breathe in) again.  Reducing Pollen Exposure  The American Academy of Allergy, Asthma and Immunology suggests the following steps to reduce your exposure to pollen during allergy seasons.    Do not hang sheets or clothing out to dry; pollen may collect on these items. Do not mow lawns or spend time around freshly cut grass; mowing stirs up pollen. Keep windows closed at night.  Keep car windows closed while driving. Minimize morning activities outdoors, a time when pollen counts are usually at their highest. Stay indoors as much as possible when pollen counts or humidity is high and on windy days when pollen tends to remain in the air longer. Use air conditioning when possible.  Many air conditioners have filters that trap the pollen spores. Use a HEPA room air filter to remove pollen form the indoor air you breathe. .endmold

## 2024-10-14 NOTE — Progress Notes (Signed)
 " Date of Service/Encounter:  10/14/24  Allergy testing appointment   Initial visit on 10/07/24, seen for allergic rhinitis and conjunctivitis, suspected contact dermatitis, vocal cord dysfunction.  Please see that note for additional details.  Today reports for allergy diagnostic testing:    DIAGNOSTICS:  Skin Testing: Environmental allergy panel. Adequate positive and negative controls. Results discussed with patient/family.  Airborne Adult Perc - 10/14/24 1516     Time Antigen Placed 1516    Allergen Manufacturer Jestine    Location Back    Number of Test 55    1. Control-Buffer 50% Glycerol Negative    2. Control-Histamine 3+    3. Bahia 3+    4. Bermuda 3+    5. Johnson 3+    6. [place]  Blue 3+    7. Meadow Fescue 3+    8. Perennial Rye 3+    9. Timothy 3+    10. Ragweed Mix 3+    11. Cocklebur 3+    12. Plantain,  English 3+    13. Baccharis 3+    14. Dog Fennel 3+    15. Russian Thistle 3+    16. Lamb's Quarters 3+    17. Sheep Sorrell 3+    18. Rough Pigweed 3+    19. Marsh Elder, Rough 3+    20. Mugwort, Common 3+    21. Box, Elder 3+    22. Cedar, red 3+    23. Sweet Gum 3+    24. Pecan Pollen 3+    25. Pine Mix 3+    26. Walnut, Black Pollen Negative    27. Red Mulberry 3+    28. Ash Mix 3+    29. Birch Mix 3+    30. Beech American Negative    31. Cottonwood, Eastern 3+    32. Hickory, White 3+    33. Maple Mix Negative    34. Oak, Eastern Mix 3+    35. Sycamore Eastern 3+    36. Alternaria Alternata 3+    37. Cladosporium Herbarum 3+    38. Aspergillus Mix Negative    39. Penicillium Mix 3+    40. Bipolaris Sorokiniana (Helminthosporium) Negative    41. Drechslera Spicifera (Curvularia) 3+    42. Mucor Plumbeus Negative    43. Fusarium Moniliforme Negative    44. Aureobasidium Pullulans (pullulara) Negative    45. Rhizopus Oryzae 3+    46. Botrytis Cinera 3+    47. Epicoccum Nigrum 3+    48. Phoma Betae Negative    49. Dust Mite Mix  Negative    50. Cat Hair 10,000 BAU/ml Negative    51.  Dog Epithelia Negative    52. Mixed Feathers Negative    53. Horse Epithelia Negative    54. Cockroach, German Negative    55. Tobacco Leaf Negative          Intradermal - 10/14/24 1557     Time Antigen Placed 0500    Location Arm    Number of Test 5    Control Negative    Mite Mix Negative    Cat 3+    Dog 3+    Cockroach Negative          Allergy testing results were read and interpreted by myself, documented by clinical staff.  Patient provided with copy of allergy testing along with avoidance measures when indicated.   Rocky Endow, MD  [facility]   -------------------- Allergic rhinitis with perennial  symptoms Chronic allergic rhinitis with perennial symptoms, including post-nasal drip and nasal congestion, exacerbated by environmental allergens. Previous allergy shots were beneficial, but she discontinued due to relocation and insurance issues. Current medications have expired, and she experiences significant discomfort due to nasal drainage. - Prescribed Qnasl  nasal spray for nasal congestion. 2 sprays daily as needed - Prescribed Atrovent  nasal spray for drainage control. 1-2 spray in each nostril twice daily a needed - continue loratadine  10 mg daily - restarted montelukast  10 mg nightly, stop if nightmares or behavior changes - Skin prick test 10/14/24: positive to grass pollen, tree pollen, weed pollen, indoor and outdoor molds, intradermal testing positive to cat and dogs; allergen avoidance.  - Plan to restart allergy immunotherapy with standard buildup due to fear of needles.  Allergic conjunctivitis with periorbital swelling Chronic allergic conjunctivitis with periorbital swelling, exacerbated by environmental allergens. Symptoms include watery eyes, swelling, and redness under the eyes. Previous treatments included loratadine  and montelukast , but loratadine  causes  drowsiness. - Continue montelukast  10 mg daily. - Prescribed Pataday  for eye symptoms. 1 drop in each eye daily as needed  Suspected allergic contact dermatitis of periorbital area Suspected allergic contact dermatitis of the periorbital area, possibly due to environmental allergens or contact with certain products. Symptoms include rash and swelling under the eyes, potentially exacerbated by watery eyes and environmental irritants. - Will consider patch testing to identify potential allergens causing contact dermatitis. - schedule patch testing at your convenience - placement will be in our [place] office on a Monday, removal on Wednesday in [place] office with first read, final read on a Friday - must keep back dry during patch tesitng - eucrisa  twice daily as needed on rash under eyes when occurs  Laryngeal spasm/vocal cord dysfunction (VCD): - uncontrolled reflux and post-nasal drainage can make VCD worse and can even be the sole cause of symptoms - will focus on drainage and allergic rhinitis control at this time - consider reflux if symptoms not improving - try VCD exercises (see information packet provided) first for breathing symptoms - consider ENT referral to ensure no other vocal cord abnormalities if  symptoms not controlled with adequate allergy management   Follow up : 3 months after starting your allergy injection, sooner if needed. Call insurance regarding coverage then call us  back to get scheduled.  It was a pleasure seeing you again in clinic today! Thank you for allowing me to participate in your care.  Rocky Endow, MD [facility]     "

## 2024-10-19 NOTE — Telephone Encounter (Signed)
 Please let her know that she is welcome to file an appeal because this was denied a third time.  To file an appeal, please send any written comments, documents or other relevant documentation with your appeal to the address listed below: [employer] [address] [place], [place] [address] Phone: Please call the toll-free member number listed on your health plan ID card. Fax: [phone] Expedited/Urgent Fax: [phone]

## 2024-10-19 NOTE — Telephone Encounter (Signed)
 Hello   We received a call yesterday evening from [facility]: [employer] rep at [employer], she called on behave of the member not being able to reach the nurse.     She stated [employer] rejected the medication Beclomethasone Dipropionate  (QNASL ) - per [place] the drug is excluded from the members plan   Alternative option she stated is Fluticasone  propionate  [place] [phone] ext [phone].

## 2024-10-20 NOTE — Telephone Encounter (Signed)
 Called spoke to pt to inform her that PA was submitted 3 times and its still be denied. Due to the diagnoses code its associated with. Pt states that nurse from the pharmacy will call her tomorrow. Pt was told that Dx codes is associate with initial office visit. [facility] [phone]

## 2024-10-21 NOTE — Telephone Encounter (Signed)
 Received a call from Dorothyann an escalation nurse [phone] ext [phone] and I explained to her that the preferred alternative Flonase  is not appropriate because she can not tolerate that. She says the plan straight up will not cover the Qnasl  because of a plan exclusion. She says she will call and speak with [facility] to she what she can do.
